# Patient Record
Sex: Female | Born: 1970 | Race: White | Hispanic: No | Marital: Single | Smoking: Current every day smoker
Health system: Southern US, Community
[De-identification: ages and names within clinical notes are randomized; demographics above are authoritative.]

## PROBLEM LIST (undated history)

## (undated) DIAGNOSIS — G43909 Migraine, unspecified, not intractable, without status migrainosus: Secondary | ICD-10-CM

## (undated) HISTORY — DX: Migraine, unspecified, not intractable, without status migrainosus: G43.909

---

## 2007-11-24 ENCOUNTER — Ambulatory Visit: Payer: Self-pay | Admitting: Internal Medicine

## 2008-11-05 ENCOUNTER — Encounter: Payer: Self-pay | Admitting: Maternal & Fetal Medicine

## 2009-01-03 ENCOUNTER — Observation Stay: Payer: Self-pay

## 2009-01-04 ENCOUNTER — Ambulatory Visit: Payer: Self-pay | Admitting: Unknown Physician Specialty

## 2009-01-19 ENCOUNTER — Observation Stay: Payer: Self-pay

## 2009-01-22 ENCOUNTER — Observation Stay: Payer: Self-pay

## 2009-02-07 ENCOUNTER — Observation Stay: Payer: Self-pay

## 2009-03-03 ENCOUNTER — Inpatient Hospital Stay: Payer: Self-pay | Admitting: Obstetrics and Gynecology

## 2010-08-26 ENCOUNTER — Ambulatory Visit: Payer: Self-pay

## 2010-09-10 ENCOUNTER — Emergency Department: Payer: Self-pay | Admitting: Emergency Medicine

## 2011-03-12 ENCOUNTER — Ambulatory Visit: Payer: Self-pay | Admitting: General Surgery

## 2011-11-30 ENCOUNTER — Emergency Department: Payer: Self-pay | Admitting: Emergency Medicine

## 2011-11-30 LAB — COMPREHENSIVE METABOLIC PANEL
Albumin: 4.3 g/dL (ref 3.4–5.0)
Anion Gap: 9 (ref 7–16)
BUN: 12 mg/dL (ref 7–18)
Calcium, Total: 8.8 mg/dL (ref 8.5–10.1)
Creatinine: 0.62 mg/dL (ref 0.60–1.30)
Glucose: 90 mg/dL (ref 65–99)
Osmolality: 284 (ref 275–301)
Potassium: 4 mmol/L (ref 3.5–5.1)
Sodium: 143 mmol/L (ref 136–145)
Total Protein: 7.8 g/dL (ref 6.4–8.2)

## 2011-11-30 LAB — TROPONIN I: Troponin-I: <0.02 ng/mL

## 2011-11-30 LAB — URINALYSIS, COMPLETE
Leukocyte Esterase: NEGATIVE
Nitrite: NEGATIVE
Ph: 7 (ref 4.5–8.0)
Protein: NEGATIVE
RBC,UR: <1 /HPF (ref 0–5)

## 2011-11-30 LAB — CBC
HGB: 14 g/dL (ref 12.0–16.0)
MCH: 33 pg (ref 26.0–34.0)
MCHC: 33.8 g/dL (ref 32.0–36.0)
MCV: 98 fL (ref 80–100)
RBC: 4.25 10*6/uL (ref 3.80–5.20)
WBC: 6 10*3/uL (ref 3.6–11.0)

## 2011-11-30 LAB — CK TOTAL AND CKMB (NOT AT ARMC)
CK, Total: 92 U/L (ref 21–215)
CK-MB: 0.9 ng/mL (ref 0.5–3.6)

## 2012-01-05 ENCOUNTER — Encounter: Payer: Self-pay | Admitting: Internal Medicine

## 2012-01-05 ENCOUNTER — Telehealth: Payer: Self-pay | Admitting: *Deleted

## 2012-01-05 ENCOUNTER — Ambulatory Visit (INDEPENDENT_AMBULATORY_CARE_PROVIDER_SITE_OTHER): Payer: BC Managed Care – PPO | Admitting: Internal Medicine

## 2012-01-05 VITALS — BP 112/70 | HR 99 | Temp 98.5°F | Ht 63.5 in | Wt 140.0 lb

## 2012-01-05 DIAGNOSIS — R079 Chest pain, unspecified: Secondary | ICD-10-CM | POA: Insufficient documentation

## 2012-01-05 DIAGNOSIS — K5900 Constipation, unspecified: Secondary | ICD-10-CM | POA: Insufficient documentation

## 2012-01-05 DIAGNOSIS — R109 Unspecified abdominal pain: Secondary | ICD-10-CM

## 2012-01-05 DIAGNOSIS — R1904 Left lower quadrant abdominal swelling, mass and lump: Secondary | ICD-10-CM

## 2012-01-05 LAB — POCT URINALYSIS DIPSTICK
Ketones, UA: NEGATIVE
Leukocytes, UA: NEGATIVE
Protein, UA: NEGATIVE
pH, UA: 5.5

## 2012-01-05 LAB — COMPREHENSIVE METABOLIC PANEL
ALT: 16 U/L (ref 0–35)
Albumin: 4.5 g/dL (ref 3.5–5.2)
CO2: 24 mEq/L (ref 19–32)
Calcium: 9.4 mg/dL (ref 8.4–10.5)
Chloride: 102 mEq/L (ref 96–112)
GFR: 121.9 mL/min (ref >60.00)
Potassium: 4.1 mEq/L (ref 3.5–5.1)
Sodium: 140 mEq/L (ref 135–145)
Total Bilirubin: 1.3 mg/dL — ABNORMAL HIGH (ref 0.3–1.2)
Total Protein: 7.3 g/dL (ref 6.0–8.3)

## 2012-01-05 LAB — LIPID PANEL: HDL: 62.9 mg/dL (ref >39.00)

## 2012-01-05 LAB — TSH: TSH: 0.66 u[IU]/mL (ref 0.35–5.50)

## 2012-01-05 LAB — CBC WITH DIFFERENTIAL/PLATELET
Basophils Absolute: 0 10*3/uL (ref 0.0–0.1)
HCT: 40.2 % (ref 36.0–46.0)
Lymphs Abs: 1.5 10*3/uL (ref 0.7–4.0)
Monocytes Relative: 6.9 % (ref 3.0–12.0)
Platelets: 281 10*3/uL (ref 150.0–400.0)
RDW: 13 % (ref 11.5–14.6)

## 2012-01-05 MED ORDER — DEXLANSOPRAZOLE 60 MG PO CPDR: 60.0000 mg | DELAYED_RELEASE_CAPSULE | Freq: Every day | ORAL | Status: DC

## 2012-01-05 NOTE — Telephone Encounter (Signed)
Pt needs urine pregnancy test for CT tomorrow. Pt coming back in, will fax results to [phone] (CT at [facility])

## 2012-01-05 NOTE — Progress Notes (Signed)
Subjective:    Patient ID: Veronica Stevens, female    DOB: December 28, 1970, 41 y.o.   MRN: [id number]  HPI 41 year old female presents to establish care. She has several concerns today. First, she notes a several month history of left upper quadrant abdominal pain. She notes that the pain is intermittent. She has not been able to identify any particular triggers such as food triggers. The pain is described as aching. She was evaluated at that [facility] ER for this pain and reports having lab work which was normal. She also notes having ultrasound of her spleen which was normal. The pain has persisted. She also describes some pain within her left chest which she describes as heaviness. This pain is not associated with shortness of breath, diaphoresis, or palpitations. It too comes and goes without any particular trigger or intervention. She was concerned about breath pathology and reports that she had evaluation by a surgeon including ultrasound of her left breast which was normal. She is also concerned about a mass within her left lower abdomen. She first noticed this several weeks ago. It is not painful. It has not changed in size. She denies any diarrhea. She does have chronic constipation for which she takes MiraLax. She denies any blood in her stool. She has never had a colonoscopy. She denies any nausea or vomiting. She reports a normal appetite. She denies weight loss or night sweats.  Outpatient Encounter Prescriptions as of 01/05/2012  Medication Sig Dispense Refill  . dexlansoprazole (DEXILANT) 60 MG capsule Take 1 capsule (60 mg total) by mouth daily.  30 capsule  0  . GREEN TEA, CAMILLIA SINENSIS, PO Take 750 mg by mouth daily.      . Multiple Vitamins-Minerals (MULTIVITAMIN WITH MINERALS) tablet Take 1 tablet by mouth daily.      . naproxen sodium (ANAPROX) 220 MG tablet Take 220 mg by mouth daily as needed.      Marland Kitchen OVER THE COUNTER MEDICATION Cell Activator 2 daily         Review of Systems  Constitutional: Negative for fever, chills, appetite change, fatigue and unexpected weight change.  HENT: Negative for ear pain, congestion, sore throat, trouble swallowing, neck pain, voice change and sinus pressure.   Eyes: Negative for visual disturbance.  Respiratory: Negative for cough, shortness of breath, wheezing and stridor.   Cardiovascular: Positive for chest pain. Negative for palpitations and leg swelling.  Gastrointestinal: Positive for abdominal pain and constipation. Negative for nausea, vomiting, diarrhea, blood in stool, abdominal distention and anal bleeding.  Genitourinary: Negative for dysuria and flank pain.  Musculoskeletal: Negative for myalgias, arthralgias and gait problem.  Skin: Negative for color change and rash.  Neurological: Negative for dizziness and headaches.  Hematological: Negative for adenopathy. Does not bruise/bleed easily.  Psychiatric/Behavioral: Negative for suicidal ideas, sleep disturbance and dysphoric mood. The patient is not nervous/anxious.    BP 112/70  Pulse 99  Temp(Src) 98.5 F (36.9 C) (Oral)  Ht 5' 3.5" (1.613 m)  Wt 140 lb (63.504 kg)  BMI 24.41 kg/m2  SpO2 99%     Objective:   Physical Exam  Constitutional: She is oriented to person, place, and time. She appears well-developed and well-nourished. No distress.  HENT:  Head: Normocephalic and atraumatic.  Right Ear: External ear normal.  Left Ear: External ear normal.  Nose: Nose normal.  Mouth/Throat: Oropharynx is clear and moist. No oropharyngeal exudate.  Eyes: Conjunctivae are normal. Pupils are equal, round, and reactive to light. Right  eye exhibits no discharge. Left eye exhibits no discharge. No scleral icterus.  Neck: Normal range of motion. Neck supple. No tracheal deviation present. No thyromegaly present.  Cardiovascular: Normal rate, regular rhythm, normal heart sounds and intact distal pulses.  Exam reveals no gallop and no friction rub.    No murmur heard. Pulmonary/Chest: Effort normal and breath sounds normal. No respiratory distress. She has no wheezes. She has no rales. She exhibits no tenderness.  Abdominal: Soft. Bowel sounds are normal. She exhibits mass (approximately 3cm diameter left lower quadrant). She exhibits no distension. There is no tenderness. There is no rebound and no guarding.  Musculoskeletal: Normal range of motion. She exhibits no edema and no tenderness.  Lymphadenopathy:    She has no cervical adenopathy.  Neurological: She is alert and oriented to person, place, and time. No cranial nerve deficit. She exhibits normal muscle tone. Coordination normal.  Skin: Skin is warm and dry. No rash noted. She is not diaphoretic. No erythema. No pallor.  Psychiatric: She has a normal mood and affect. Her behavior is normal. Judgment and thought content normal.          Assessment & Plan:

## 2012-01-05 NOTE — Progress Notes (Signed)
Addended by: Jobie Quaker on: 01/05/2012 04:24 PM   Modules accepted: Orders

## 2012-01-05 NOTE — Assessment & Plan Note (Signed)
Question if pain may be referred from left upper quadrant. EKG was normal today. Patient does however have some risk for myocardial ischemia including smoking history. Will start by getting CT of the abdomen and pelvis to evaluate left lower quadrant mass. We'll also treat with proton pump inhibitor. If pain persist , will consider cardiology evaluation for stress test.

## 2012-01-05 NOTE — Assessment & Plan Note (Signed)
Chronic. We discussed increasing intake of MiraLax to up to 3 times per day. We also discussed potentially trying Amitiza. Will wait on results of CT of the abdomen prior to changing medication.

## 2012-01-05 NOTE — Assessment & Plan Note (Signed)
Patient has a palpable mass in her left lower quadrant. The mass seems most consistent with an enlarged lymph node, however bowel oral. Pathology would also be a consideration. Will get CT of the abdomen and pelvis for further evaluation.

## 2012-01-05 NOTE — Telephone Encounter (Signed)
Copy of pregnancy test faxed to [facility]

## 2012-01-05 NOTE — Assessment & Plan Note (Signed)
Patient reports recent workup for this at [facility] was normal. Question if symptoms may be related to gastritis or ulcer. Will repeat labs including CMP and we'll test for H. pylori today. Will start empiric proton pump inhibitor. Patient will have CT of the abdomen given that she has a palpable mass in her left lower quadrant, however I do not think this is causing her left upper quadrant pain. Followup in 2 weeks.

## 2012-01-06 ENCOUNTER — Ambulatory Visit: Payer: Self-pay | Admitting: Internal Medicine

## 2012-01-06 ENCOUNTER — Telehealth: Payer: Self-pay | Admitting: *Deleted

## 2012-01-06 NOTE — Telephone Encounter (Signed)
Message copied by Vernie Murders on Wed Jan 06, 2012  5:56 PM ------      Message from: Ronna Polio A      Created: Tue Jan 05, 2012  4:10 PM       Labs were fine except that bilirubin level just on upper limit of normal.  I will look over abdominal US report from [facility] and we will visualize pancreas, liver, gallbladder on CT abdomen to see if any underlying abnormality leading to elevated bili.

## 2012-01-06 NOTE — Telephone Encounter (Signed)
Mychart mess sent

## 2012-01-07 ENCOUNTER — Telehealth: Payer: Self-pay | Admitting: Internal Medicine

## 2012-01-07 ENCOUNTER — Encounter: Payer: Self-pay | Admitting: Internal Medicine

## 2012-01-07 DIAGNOSIS — R935 Abnormal findings on diagnostic imaging of other abdominal regions, including retroperitoneum: Secondary | ICD-10-CM

## 2012-01-07 LAB — HELICOBACTER PYLORI  ANTIBODY, IGM: Helicobacter pylori, IgM: 0 U/mL (ref <9.0)

## 2012-01-07 NOTE — Telephone Encounter (Signed)
CT abdomen showed solid area in the liver.  This may represent a benign lesion such as a hemangioma, or may be a malignant lesion.  Radiology recommended setting up MRI for further evaluation.  Order placed. Tried to contact patient, but no answer, so left message to call back.  Also, atherosclerosis noted on CT in the aorta.  Pt should strongly consider smoking cessation, as she is very young to already have plaque in her blood vessels.

## 2012-01-07 NOTE — Telephone Encounter (Signed)
Dr Dan Humphreys spoke w/pt and ordered MRI

## 2012-01-12 ENCOUNTER — Encounter: Payer: Self-pay | Admitting: Internal Medicine

## 2012-01-13 ENCOUNTER — Ambulatory Visit: Payer: Self-pay | Admitting: Internal Medicine

## 2012-01-13 LAB — CREATININE, SERUM: EGFR (African American): >60

## 2012-01-15 ENCOUNTER — Telehealth: Payer: Self-pay | Admitting: Internal Medicine

## 2012-01-15 NOTE — Telephone Encounter (Signed)
[phone] Pt called to get mri results  These were done [facility] on Wednesday night

## 2012-01-15 NOTE — Telephone Encounter (Signed)
My chart message was sent today. She viewed the message today at 3:18

## 2012-01-15 NOTE — Telephone Encounter (Signed)
MRI abdomen showed benign hemangioma in the liver.  They recommended repeating ultrasound in 1-2 years to show that it is stable.

## 2012-01-19 ENCOUNTER — Encounter: Payer: Self-pay | Admitting: Internal Medicine

## 2012-01-25 ENCOUNTER — Ambulatory Visit: Payer: BC Managed Care – PPO | Admitting: Internal Medicine

## 2012-04-25 ENCOUNTER — Telehealth: Payer: Self-pay | Admitting: Internal Medicine

## 2012-04-25 NOTE — Telephone Encounter (Signed)
Caller: Veronica Stevens/Patient; PCP: Ronna Polio; CB#: [phone];  Call regarding Sore Throat;  Onset: 04/23/12.  Temp 99.5 po at 1100.  LMP unknown; Mirena IUD. Throat red with white pustules and bilateral ear pain.  Advised to see MD within 4 hrs for marked difficulty swallowing d/t sore throat unresponsive to 12 hrs of home care per Sore Throat or Hoarseness Guideline. No appts remain for 04/25/12; info noted and sent to Guy/Can Pool for call back to pt.

## 2012-04-25 NOTE — Telephone Encounter (Signed)
Should go to urgent care as no appointments.

## 2012-04-25 NOTE — Telephone Encounter (Signed)
Patient advised as instructed via telephone. 

## 2012-12-17 ENCOUNTER — Other Ambulatory Visit: Payer: Self-pay

## 2013-09-07 ENCOUNTER — Other Ambulatory Visit: Payer: Self-pay

## 2014-10-31 ENCOUNTER — Ambulatory Visit: Payer: Self-pay | Admitting: Orthopedic Surgery

## 2015-11-26 ENCOUNTER — Other Ambulatory Visit: Payer: Self-pay | Admitting: Student

## 2015-11-26 DIAGNOSIS — R1032 Left lower quadrant pain: Secondary | ICD-10-CM

## 2015-12-03 ENCOUNTER — Ambulatory Visit
Admission: RE | Admit: 2015-12-03 | Discharge: 2015-12-03 | Disposition: A | Discharge status: Home / Self Care | Payer: BLUE CROSS/BLUE SHIELD | Source: Ambulatory Visit | Attending: Student | Admitting: Student

## 2015-12-03 DIAGNOSIS — R1032 Left lower quadrant pain: Secondary | ICD-10-CM | POA: Diagnosis not present

## 2015-12-03 DIAGNOSIS — D1809 Hemangioma of other sites: Secondary | ICD-10-CM | POA: Diagnosis not present

## 2015-12-03 MED ORDER — IOHEXOL 300 MG/ML  SOLN
100.0000 mL | Freq: Once | INTRAMUSCULAR | Status: AC | PRN
  Administered 2015-12-03: 100 mL via INTRAVENOUS

## 2016-08-23 IMAGING — CT CT ABD-PELV W/ CM
1 of 3 series · 14 of 32 positions shown, 19 images · IV contrast (omnipaque)
Comparison: 01/06/2012, 01/13/2012

CLINICAL DATA: Left lower quadrant pain for several months, initial
encounter

EXAM:
CT ABDOMEN AND PELVIS WITH CONTRAST
TECHNIQUE: Multidetector CT imaging of the abdomen and pelvis was performed
using the standard protocol following bolus administration of
intravenous contrast.
CONTRAST:  100mL OMNIPAQUE IOHEXOL 300 MG/ML  SOLN

[Series 2: axial st · axial · 0.66mm/px · z∈[-965,-575]mm · 14 of 88 slices shown, 19 images]
[im 5/88  soft-tissue]
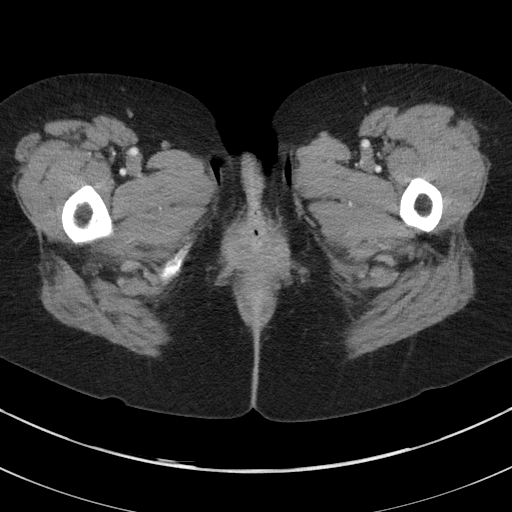
[im 5/88  bone]
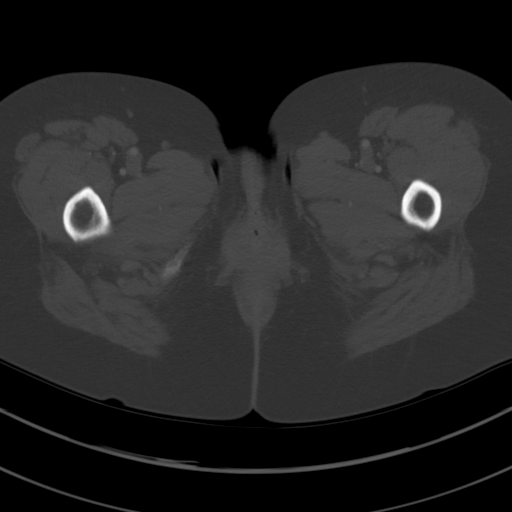
[im 14/88  soft-tissue]
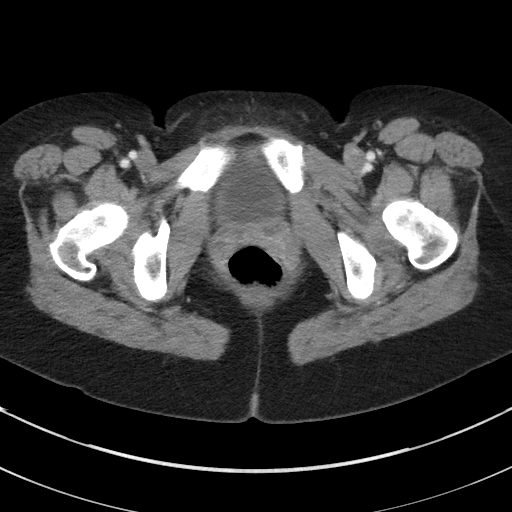
[im 19/88  soft-tissue]
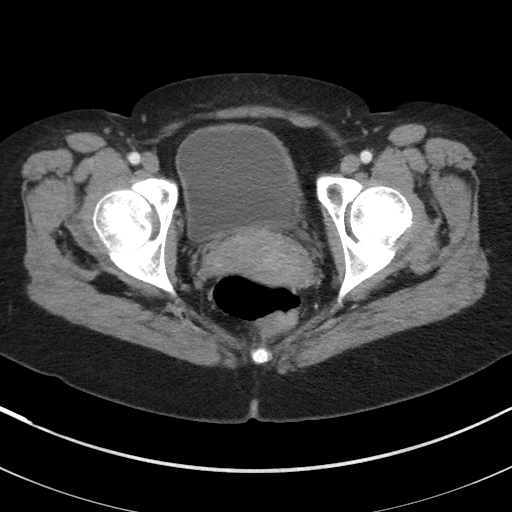
[im 23/88  soft-tissue]
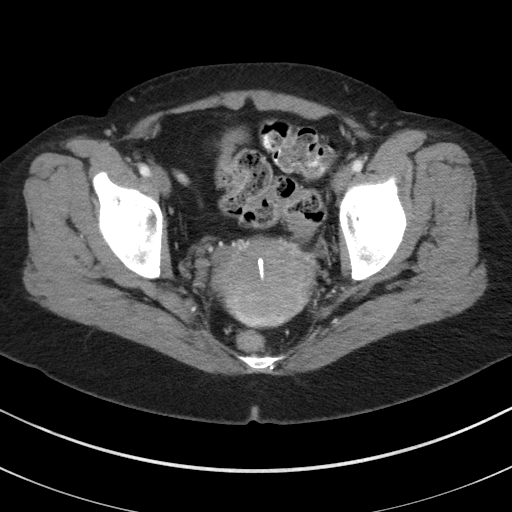
[im 33/88  soft-tissue]
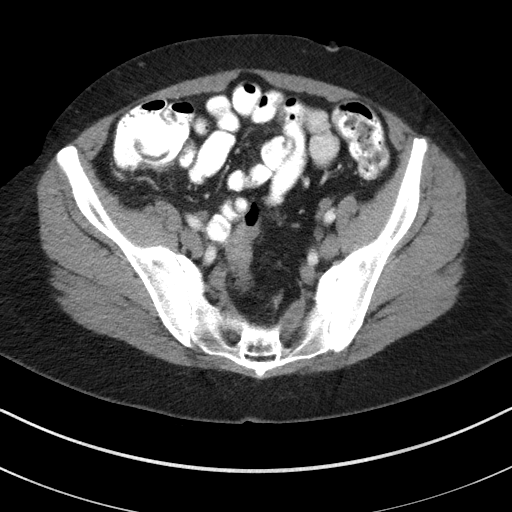
[im 37/88  soft-tissue]
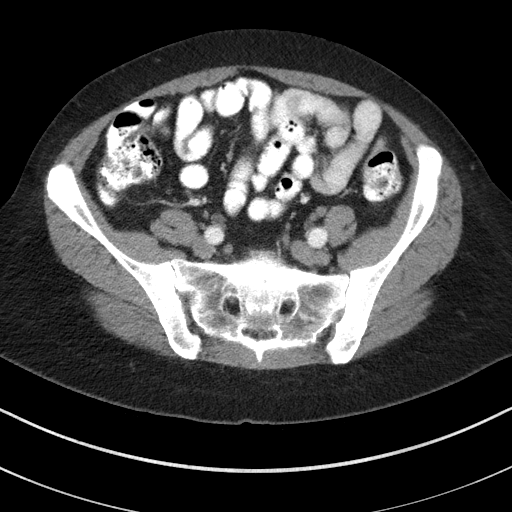
[im 46/88  soft-tissue]
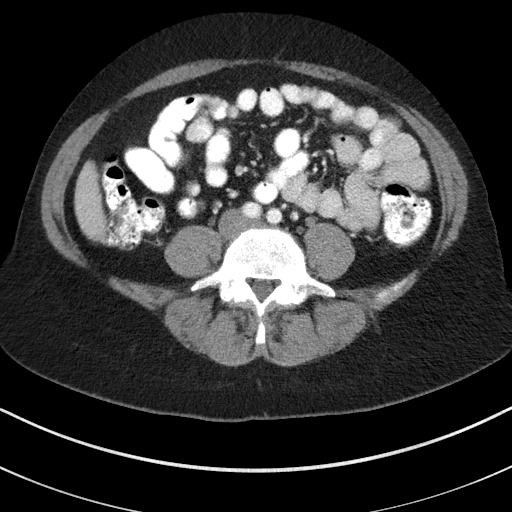
[im 51/88  soft-tissue]
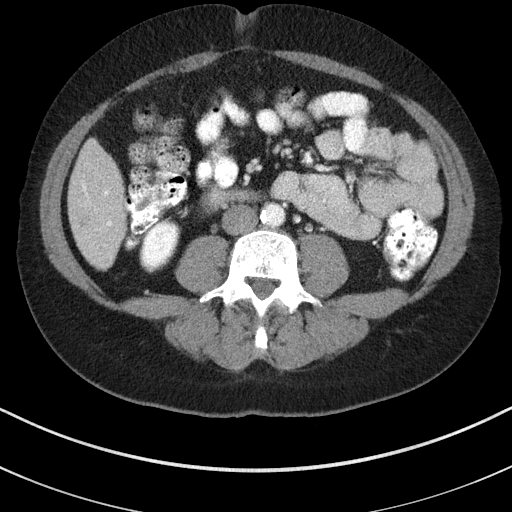
[im 55/88  soft-tissue]
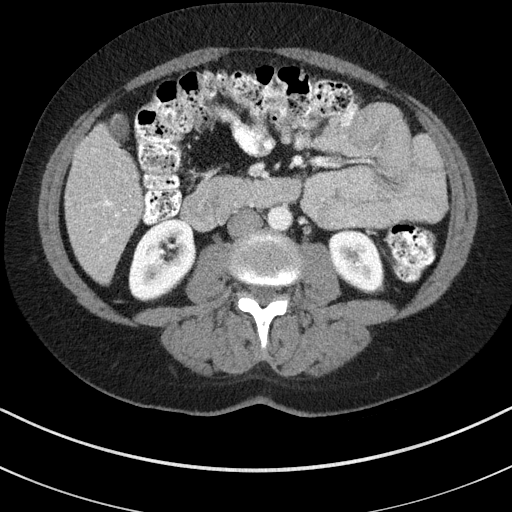
[im 55/88  bone]
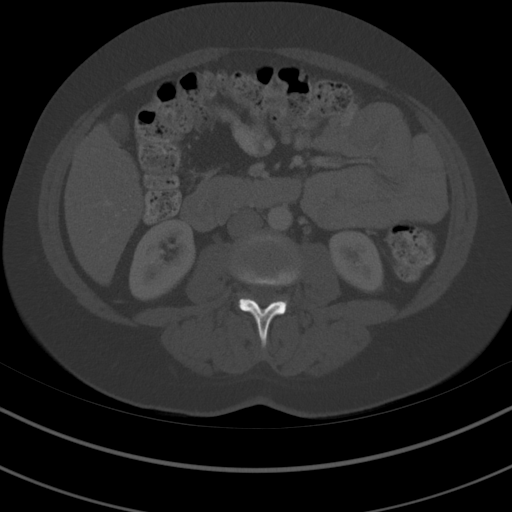
[im 65/88  soft-tissue]
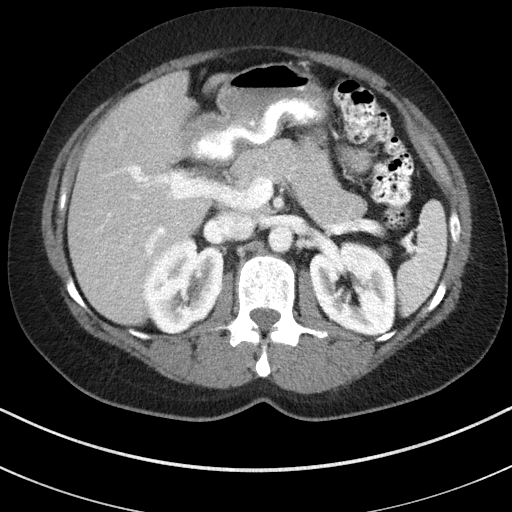
[im 69/88  soft-tissue]
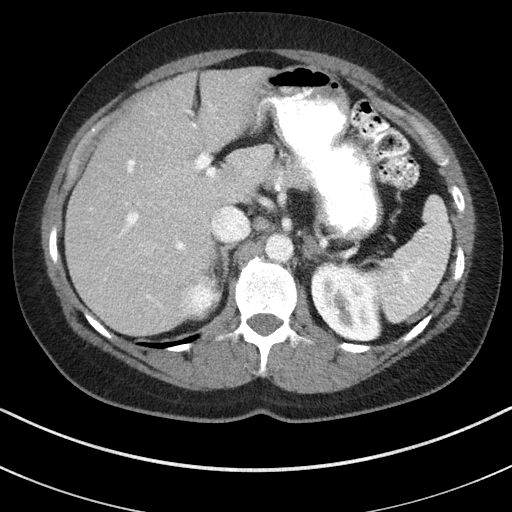
[im 69/88  lung]
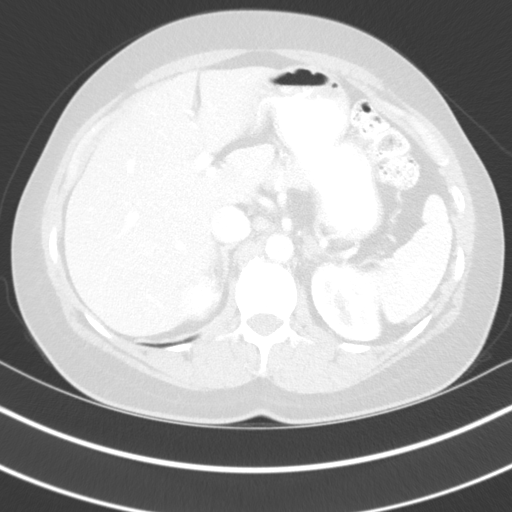
[im 74/88  soft-tissue]
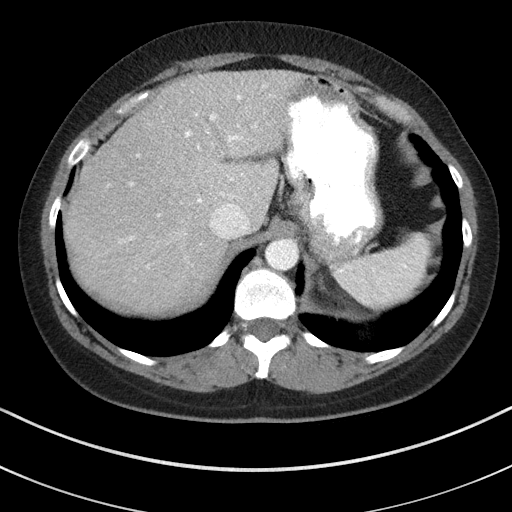
[im 74/88  lung]
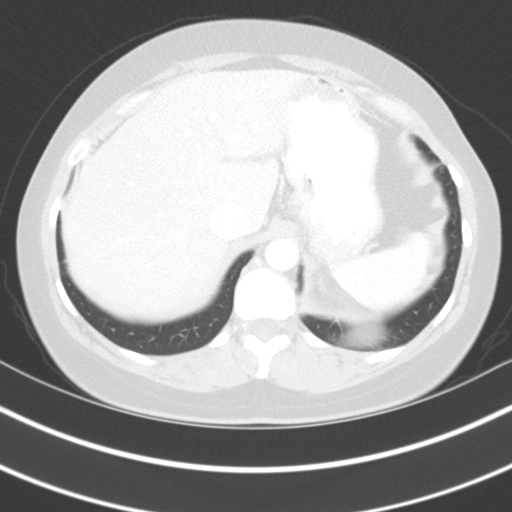
[im 78/88  lung]
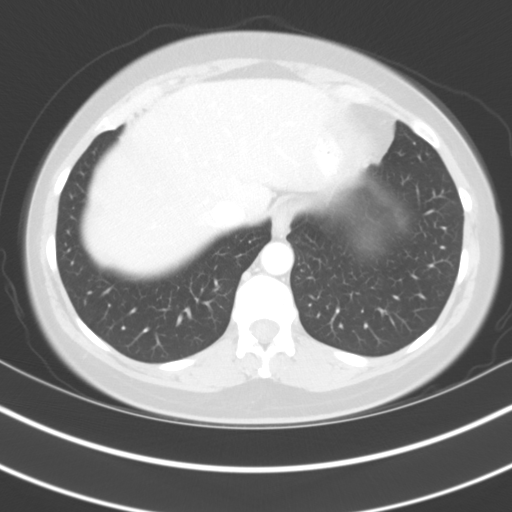
[im 83/88  soft-tissue]
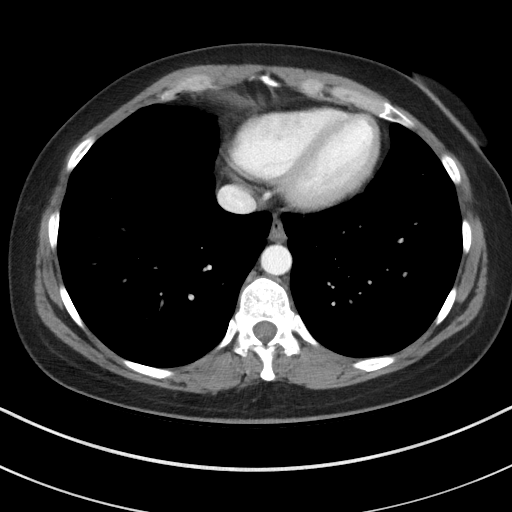
[im 83/88  lung]
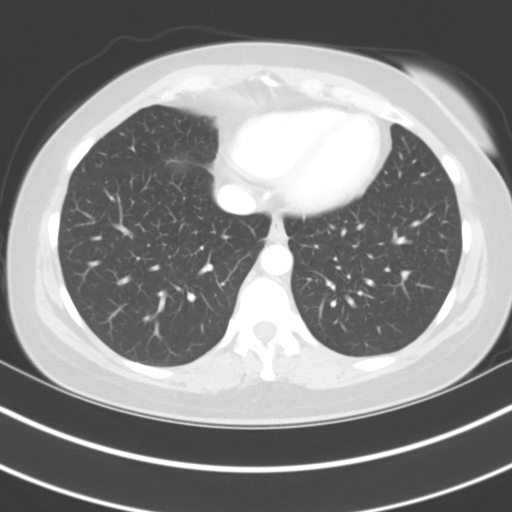

[14 of 32 positions shown; findings below may reference images not displayed]

FINDINGS: Lung bases are free of acute infiltrate or sizable effusion.

The liver again shows evidence of a peripherally enhancing lesion in
the right lobe inferiorly. It measures approximately 2 cm in
greatest dimension and is stable from the prior exams. Additionally
a smaller hypodensity is noted inferiorly which is not well
visualized on the delayed images consistent with a smaller
hemangioma. This also corresponds to that seen on prior MRI.

The gallbladder, spleen, adrenal glands and pancreas are within
normal limits. The kidneys are well visualized bilaterally without
renal calculi or obstructive changes. Delayed images demonstrate
normal excretion of contrast bilaterally.

The appendix is well visualized. The uterus shows an IUD in place
stable from the prior exam. Previously seen ovarian cystic lesion is
not well appreciated on this exam. The bladder is well distended. No
free pelvic fluid is noted. The area of clinical concern no focal
abnormality is noted. Mild aortoiliac calcifications are seen. The
osseous structures show stable sclerosis in the left sacroiliac
joint. No acute bony abnormality is seen.
IMPRESSION: Stable hemangiomas within the liver.

No other focal abnormality is seen.

## 2017-09-02 ENCOUNTER — Ambulatory Visit: Payer: Self-pay | Admitting: Certified Nurse Midwife

## 2017-09-16 ENCOUNTER — Ambulatory Visit: Payer: Self-pay | Admitting: Certified Nurse Midwife

## 2021-06-05 ENCOUNTER — Encounter: Payer: Self-pay | Admitting: Obstetrics and Gynecology

## 2021-06-05 ENCOUNTER — Other Ambulatory Visit (HOSPITAL_COMMUNITY)
Admission: RE | Admit: 2021-06-05 | Discharge: 2021-06-05 | Disposition: A | Discharge status: Home / Self Care | Payer: Medicaid Other | Source: Ambulatory Visit | Attending: Obstetrics and Gynecology | Admitting: Obstetrics and Gynecology

## 2021-06-05 ENCOUNTER — Other Ambulatory Visit: Payer: Self-pay

## 2021-06-05 ENCOUNTER — Ambulatory Visit (INDEPENDENT_AMBULATORY_CARE_PROVIDER_SITE_OTHER): Payer: Medicaid Other | Admitting: Obstetrics and Gynecology

## 2021-06-05 VITALS — Wt 162.0 lb

## 2021-06-05 DIAGNOSIS — R300 Dysuria: Secondary | ICD-10-CM | POA: Diagnosis present

## 2021-06-05 DIAGNOSIS — B9620 Unspecified Escherichia coli [E. coli] as the cause of diseases classified elsewhere: Secondary | ICD-10-CM | POA: Diagnosis not present

## 2021-06-05 DIAGNOSIS — R1032 Left lower quadrant pain: Secondary | ICD-10-CM

## 2021-06-05 DIAGNOSIS — Z113 Encounter for screening for infections with a predominantly sexual mode of transmission: Secondary | ICD-10-CM | POA: Insufficient documentation

## 2021-06-05 DIAGNOSIS — Z1151 Encounter for screening for human papillomavirus (HPV): Secondary | ICD-10-CM | POA: Insufficient documentation

## 2021-06-05 DIAGNOSIS — Z975 Presence of (intrauterine) contraceptive device: Secondary | ICD-10-CM | POA: Insufficient documentation

## 2021-06-05 DIAGNOSIS — Z124 Encounter for screening for malignant neoplasm of cervix: Secondary | ICD-10-CM

## 2021-06-05 DIAGNOSIS — Z01411 Encounter for gynecological examination (general) (routine) with abnormal findings: Secondary | ICD-10-CM | POA: Diagnosis not present

## 2021-06-05 DIAGNOSIS — N390 Urinary tract infection, site not specified: Secondary | ICD-10-CM | POA: Diagnosis not present

## 2021-06-05 DIAGNOSIS — R319 Hematuria, unspecified: Secondary | ICD-10-CM

## 2021-06-05 DIAGNOSIS — N852 Hypertrophy of uterus: Secondary | ICD-10-CM

## 2021-06-05 DIAGNOSIS — R102 Pelvic and perineal pain: Secondary | ICD-10-CM

## 2021-06-05 LAB — POCT URINALYSIS DIPSTICK
Appearance: NORMAL
Bilirubin, UA: NEGATIVE
Blood, UA: NEGATIVE
Glucose, UA: NEGATIVE
Ketones, UA: NEGATIVE
Nitrite, UA: NEGATIVE
Odor: NORMAL
Protein, UA: NEGATIVE
Spec Grav, UA: 1.02 (ref 1.010–1.025)
Urobilinogen, UA: 0.2 E.U./dL
pH, UA: 6 (ref 5.0–8.0)

## 2021-06-05 NOTE — Progress Notes (Signed)
Obstetrics & Gynecology Office Visit   Chief Complaint  Patient presents with   Urinary Tract Infection    Pain & pressure with urination x 1 mo. Blood in urine on Saturday 05/31/21   Abdominal Pain    Left lower quadrant/Ovary area x1 wk    History of Present Illness: 50 y.o. [id number] female who presents with left lower quadrant pain.  The pain has been consistent over the last week. However, she has had it off and on over several years.  The pain radiates up to he ribs, goes to her back area.  Alleviating factors: nothing. Aggravating: nothing.  Associated symptoms: intercourse may make it feel worse.  She describes the pain as pulsing, throbbing, stabbing sensation.  She rates the pain at 5/10.   She also feels like she is getting UTI symptoms. She had blood in her urine this past weekend.  She also started having discomfort with intercourse with her fiance.  This has been present for the past couple of days.     She believes her Mirena IUD placed in 2015.    For her UTI symptoms, she has pressure while voiding, she notes no increase in frequency.  She does drink a lot of water. She felt like she had one about a month ago and her symptoms disappeared.    She is not having periods with her IUD.  She notes no abnormal discharge. She denies vaginal itching, burning, irritation.  She denies diarrhea and constipation.  She denies fevers, chills.   Past Medical History:  Diagnosis Date   Migraines     Past Surgical History:  Procedure Laterality Date   VAGINAL DELIVERY     x4    Gynecologic History: No LMP recorded. (Menstrual status: IUD).  Obstetric [id number], s/p SVD x 4  Family History  Problem Relation Age of Onset   Cancer Maternal Grandmother        Lung & Bone - 82's   Thyroid disease Mother    Early death Brother        suicide   Early death Brother        suicide   Other Brother        Drug abuse   Other Brother        Drug abuse   Cancer Maternal Uncle         Lung - 64's   Ovarian cancer Neg Hx    Breast cancer Neg Hx     Social History   Socioeconomic History   Marital status: Single    Spouse name: Not on file   Number of children: 4   Years of education: Not on file   Highest education level: Not on file  Occupational History   Not on file  Tobacco Use   Smoking status: Every Day    Packs/day: 1.00    Types: Cigarettes   Smokeless tobacco: Never  Vaping Use   Vaping Use: Never used  Substance and Sexual Activity   Alcohol use: Yes    Comment: once week   Drug use: No   Sexual activity: Yes    Birth control/protection: I.U.D.  Other Topics Concern   Not on file  Social History Narrative   Lives in [place] with husband and 3 sons. Worked at [employer].      Regular Exercise -  NO   Daily Caffeine Use:  2 cups coffee            Social Determinants  of Health   Financial Resource Strain: Not on file  Food Insecurity: Not on file  Transportation Needs: Not on file  Physical Activity: Not on file  Stress: Not on file  Social Connections: Not on file  Intimate Partner Violence: Not on file    No Known Allergies  Prior to Admission medications   Medication Sig Start Date End Date Taking? Authorizing Provider  ibuprofen (ADVIL) 200 MG tablet Take by mouth.   Yes [provider]  levonorgestrel (MIRENA) 20 MCG/DAY IUD by Intrauterine route.   Yes [provider]  Multiple Vitamins-Minerals (MULTIVITAMIN WITH MINERALS) tablet Take 1 tablet by mouth daily.   Yes [provider]    Review of Systems  Constitutional: Negative.   HENT: Negative.    Eyes: Negative.   Respiratory: Negative.    Cardiovascular: Negative.   Gastrointestinal: Negative.   Genitourinary: Negative.   Musculoskeletal: Negative.   Skin: Negative.   Neurological: Negative.   Psychiatric/Behavioral: Negative.      Physical Exam BP (P) 120/80 (Cuff Size: Normal)   Pulse (P) 94   Ht (P) '5\' 4"'$   (1.626 m)   Wt 162 lb (73.5 kg)   BMI (P) 27.81 kg/m  No LMP recorded. (Menstrual status: IUD). Physical Exam Constitutional:      General: She is not in acute distress.    Appearance: Normal appearance. She is well-developed.  Genitourinary:     Vulva, bladder and urethral meatus normal.     No lesions in the vagina.     Right Labia: No rash, tenderness, lesions, skin changes or Bartholin's cyst.    Left Labia: No tenderness, lesions, skin changes, Bartholin's cyst or rash.    No inguinal adenopathy present in the right or left side.    Pelvic Tanner Score: 5/5.    No vaginal erythema, tenderness or bleeding.      Right Adnexa: not tender, not full and no mass present.    Left Adnexa: full and palpable.    Left Adnexa: not tender and no mass present.    No cervical motion tenderness, friability, lesion or polyp.     IUD strings visualized.     Uterus is enlarged and irregular.     Uterus is not fixed or tender.     Uterus is anteverted.     No urethral tenderness or mass present.     Pelvic exam was performed with patient in the lithotomy position.  HENT:     Head: Normocephalic and atraumatic.  Eyes:     General: No scleral icterus.    Conjunctiva/sclera: Conjunctivae normal.  Cardiovascular:     Rate and Rhythm: Normal rate and regular rhythm.     Heart sounds: No murmur heard.   No friction rub. No gallop.  Pulmonary:     Effort: Pulmonary effort is normal. No respiratory distress.     Breath sounds: Normal breath sounds. No wheezing or rales.  Abdominal:     General: Bowel sounds are normal. There is no distension.     Palpations: Abdomen is soft. There is no mass.     Tenderness: There is no abdominal tenderness. There is no guarding or rebound.     Hernia: There is no hernia in the left inguinal area or right inguinal area.  Musculoskeletal:        General: Normal range of motion.     Cervical back: Normal range of motion and neck supple.  Lymphadenopathy:      Lower Body:  No right inguinal adenopathy. No left inguinal adenopathy.  Neurological:     General: No focal deficit present.     Mental Status: She is alert and oriented to person, place, and time.     Cranial Nerves: No cranial nerve deficit.  Skin:    General: Skin is warm and dry.     Findings: No erythema.  Psychiatric:        Mood and Affect: Mood normal.        Behavior: Behavior normal.        Judgment: Judgment normal.    Female chaperone present for pelvic and breast  portions of the physical exam  Assessment: 50 y.o. No obstetric history on file. female here for  1. Left lower quadrant abdominal pain   2. Dysuria   3. Hematuria, unspecified type   4. Screen for STD (sexually transmitted disease)   5. Pap smear for cervical cancer screening   6. Adnexal tenderness, left   7. Enlarged uterus      Plan: Problem List Items Addressed This Visit   None Visit Diagnoses     Left lower quadrant abdominal pain    -  Primary   Relevant Orders   POCT Urinalysis Dipstick (Completed)   Cytology - PAP   Urine Culture   US PELVIC COMPLETE WITH TRANSVAGINAL   Dysuria       Relevant Orders   POCT Urinalysis Dipstick (Completed)   Urine Culture   Hematuria, unspecified type       Relevant Orders   POCT Urinalysis Dipstick (Completed)   Urine Culture   Screen for STD (sexually transmitted disease)       Relevant Orders   Cytology - PAP   Pap smear for cervical cancer screening       Relevant Orders   Cytology - PAP   Adnexal tenderness, left       Relevant Orders   US PELVIC COMPLETE WITH TRANSVAGINAL   Enlarged uterus       Relevant Orders   US PELVIC COMPLETE WITH TRANSVAGINAL      Left lower quadrant abdominal pain:  Pelvic ultrasound for pain and abnormal findings on exam Pap smear with STI screening  Dysuria/hematuria: Urine culture  Discussed findings and plan for diagnosis and possible management. Will need to exchange IUD, if she desires.   A total  of 35 minutes were spent face-to-face with the patient as well as preparation, review, communication, and documentation during this encounter.    Prentice Docker, MD 06/05/2021 1:24 PM

## 2021-06-06 ENCOUNTER — Telehealth: Payer: Self-pay

## 2021-06-06 NOTE — Telephone Encounter (Signed)
Pt calling; was seen yesterday for UTI; saw labs on MyChart showed 1+ leuks; doesn;t know if inf or not nor about a rx. [phone]  Adv what she saw is the urinalysis and the presence of leuks which fight inf.  Pt aware will get culture results over the weekend and will be notified of results. If rx needed it will be sent Monday.

## 2021-06-09 ENCOUNTER — Telehealth: Payer: Self-pay

## 2021-06-09 ENCOUNTER — Other Ambulatory Visit: Payer: Self-pay | Admitting: Advanced Practice Midwife

## 2021-06-09 DIAGNOSIS — N3000 Acute cystitis without hematuria: Secondary | ICD-10-CM

## 2021-06-09 LAB — URINE CULTURE

## 2021-06-09 MED ORDER — CEPHALEXIN 500 MG PO CAPS: 500.0000 mg | ORAL_CAPSULE | Freq: Three times a day (TID) | ORAL | 0 refills | Status: DC

## 2021-06-09 NOTE — Progress Notes (Signed)
Rx keflex sent to treat 50-100k e coli. Message to patient.

## 2021-06-09 NOTE — Telephone Encounter (Signed)
Patient just received message thru my chart on + urine culture results. Inquiring if she will be treated. [phone]

## 2021-06-09 NOTE — Telephone Encounter (Signed)
Sent patient Rx to treat UTI and message to let her know.

## 2021-06-09 NOTE — Telephone Encounter (Signed)
Spoke w/patient. Advised SDJ out of office until tomorrow. Sending message to Rod Can (on Call provider).

## 2021-06-10 ENCOUNTER — Ambulatory Visit: Payer: BLUE CROSS/BLUE SHIELD

## 2021-06-13 ENCOUNTER — Telehealth: Payer: Self-pay

## 2021-06-13 LAB — CYTOLOGY - PAP
Chlamydia: NEGATIVE
Comment: NEGATIVE
Comment: NEGATIVE
Comment: NEGATIVE
Comment: NEGATIVE
Comment: NORMAL
HPV 16: NEGATIVE
HPV 18 / 45: POSITIVE — AB
High risk HPV: POSITIVE — AB
Neisseria Gonorrhea: NEGATIVE
Trichomonas: NEGATIVE

## 2021-06-13 NOTE — Telephone Encounter (Signed)
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 She doesn't want to spend the whole weekend worrying about if something is wrong. [phone]

## 2021-06-13 NOTE — Telephone Encounter (Signed)
Spoke w/patient. Advised Dr. Glennon Mac is out of the office until Tuesday. Explained HPV infection/virus same as gential warts, can cause cervical cancer. Likely that she will be scheduled for a colposcopy for further evaluation. Explained procedure. Patient appreciative of information given. Will await call from Dr. Glennon Mac.

## 2021-06-17 NOTE — Telephone Encounter (Signed)
Pt calling; is SDJ going to call her today about her pap smear?  [phone]  Pt adv SDJ has been in OR today and is on call; msg has been sent to him and he will get back with her just can't tell her when.

## 2021-06-19 NOTE — Telephone Encounter (Signed)
Discussed Pap smear results.  Recommend colposcopy to prevent cervical cancer.  All questions answered.  We will schedule for soon.  Patient voiced agreement and understanding.

## 2021-06-24 ENCOUNTER — Telehealth: Payer: Self-pay

## 2021-06-24 NOTE — Telephone Encounter (Signed)
Pt calling to see if there is any prep she needs to do before colpo 07/11/21.  [phone]  Pt aware there is no prep.

## 2021-06-26 ENCOUNTER — Ambulatory Visit: Payer: Medicaid Other | Admitting: Obstetrics and Gynecology

## 2021-07-09 ENCOUNTER — Other Ambulatory Visit: Payer: Self-pay

## 2021-07-09 ENCOUNTER — Ambulatory Visit
Admission: RE | Admit: 2021-07-09 | Discharge: 2021-07-09 | Disposition: A | Discharge status: Home / Self Care | Payer: 59 | Source: Ambulatory Visit | Attending: Obstetrics and Gynecology | Admitting: Obstetrics and Gynecology

## 2021-07-09 DIAGNOSIS — R102 Pelvic and perineal pain: Secondary | ICD-10-CM | POA: Diagnosis present

## 2021-07-09 DIAGNOSIS — R1032 Left lower quadrant pain: Secondary | ICD-10-CM | POA: Diagnosis present

## 2021-07-09 DIAGNOSIS — N852 Hypertrophy of uterus: Secondary | ICD-10-CM | POA: Insufficient documentation

## 2021-07-11 ENCOUNTER — Other Ambulatory Visit (HOSPITAL_COMMUNITY)
Admission: RE | Admit: 2021-07-11 | Discharge: 2021-07-11 | Disposition: A | Discharge status: Home / Self Care | Payer: 59 | Source: Ambulatory Visit | Attending: Obstetrics and Gynecology | Admitting: Obstetrics and Gynecology

## 2021-07-11 ENCOUNTER — Ambulatory Visit: Payer: Medicaid Other | Admitting: Obstetrics and Gynecology

## 2021-07-11 ENCOUNTER — Other Ambulatory Visit: Payer: Self-pay

## 2021-07-11 ENCOUNTER — Ambulatory Visit (INDEPENDENT_AMBULATORY_CARE_PROVIDER_SITE_OTHER): Payer: 59 | Admitting: Obstetrics and Gynecology

## 2021-07-11 ENCOUNTER — Encounter: Payer: Self-pay | Admitting: Obstetrics and Gynecology

## 2021-07-11 VITALS — BP 128/74 | Ht 64.0 in | Wt 169.0 lb

## 2021-07-11 DIAGNOSIS — R87612 Low grade squamous intraepithelial lesion on cytologic smear of cervix (LGSIL): Secondary | ICD-10-CM | POA: Diagnosis not present

## 2021-07-11 NOTE — Progress Notes (Signed)
HPI:  Veronica Stevens is a 50 y.o.  [id number]  who presents today for evaluation and management of abnormal cervical cytology.    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+   OB History  Gravida Para Term Preterm AB Living  '5 4 3 1 1 4  '$ SAB IAB Ectopic Multiple Live Births  1       4    # Outcome Date GA Lbr Len/2nd Weight Sex Delivery Anes PTL Lv  5 Term 03/03/09    M Vag-Spont   LIV  4 Preterm 01/18/03    M Vag-Spont   LIV  3 SAB 03/2002          2 Term 08/29/00    M Vag-Spont   LIV  1 Term 01/13/91    Jerilynn Mages Vag-Spont   LIV    Past Medical History:  Diagnosis Date   Migraines     Past Surgical History:  Procedure Laterality Date   VAGINAL DELIVERY     x4    SOCIAL HISTORY:  Social History   Substance and Sexual Activity  Alcohol Use Yes   Comment: once week    Social History   Substance and Sexual Activity  Drug Use No     Family History  Problem Relation Age of Onset   Cancer Maternal Grandmother        Lung & Bone - 64's   Thyroid disease Mother    Early death Brother        suicide   Early death Brother        suicide   Other Brother        Drug abuse   Other Brother        Drug abuse   Cancer Maternal Uncle        Lung - 50's   Ovarian cancer Neg Hx    Breast cancer Neg Hx     ALLERGIES:  Patient has no known allergies.  Current Outpatient Medications on File Prior to Visit  Medication Sig Dispense Refill   cephALEXin (KEFLEX) 500 MG capsule Take 1 capsule (500 mg total) by mouth 3 (three) times daily. 21 capsule 0   ibuprofen (ADVIL) 200 MG tablet Take by mouth.     levonorgestrel (MIRENA) 20 MCG/DAY IUD by Intrauterine route.     Multiple Vitamins-Minerals (MULTIVITAMIN WITH MINERALS) tablet Take 1 tablet by mouth daily.     No current facility-administered medications on file prior to visit.    Physical Exam: -Vitals:  BP 128/74   Ht '5\' 4"'$  (1.626 m)   Wt 169 lb (76.7 kg)   BMI 29.01 kg/m  GEN: WD, WN, NAD.  A+ O x 3, good  mood and affect. ABD:  NT, ND.  Soft, no masses.  No hernias noted.   Pelvic:   Vulva: Normal appearance.  No lesions.  Vagina: No lesions or abnormalities noted.  Support: Normal pelvic support.  Urethra No masses tenderness or scarring.  Meatus Normal size without lesions or prolapse.  Cervix: See below.  Anus: Normal exam.  No lesions.  Perineum: Normal exam.  No lesions.        Bimanual   Uterus: Normal size.  Non-tender.  Mobile.  AV.  Adnexae: No masses.  Non-tender to palpation.  Cul-de-sac: Negative for abnormality.   PROCEDURE: 1.  Urine Pregnancy Test:  not done  2.  Colposcopy performed with 4% acetic acid after verbal consent obtained                                         -  Aceto-white Lesions Location(s): mildly at 5, and anterior from 10-12 o'clock.              -Biopsy performed at 5, 10, and 12 o'clock               -ECC indicated and performed: Yes.       -Biopsy sites made hemostatic with pressure, AgNO3, and/or Monsel's solution   -Satisfactory colposcopy: No.    -Evidence of Invasive cervical CA :  NO  ASSESSMENT:  Veronica Stevens is a 50 y.o. [id number] here for  1. LGSIL on Pap smear of cervix   .  PLAN: I discussed the grading system of pap smears and HPV high risk viral types.  We will discuss and base management after colpo results return.  Will decide about changing IUD based on these results. Reviewed results of ultrasound, which were normal.  Will contemplate next steps and discuss with her when we follow up with results from today.      Prentice Docker, MD  [facility], [facility] 07/11/2021  3:58 PM

## 2021-07-15 LAB — SURGICAL PATHOLOGY

## 2021-08-04 ENCOUNTER — Telehealth: Payer: Self-pay | Admitting: Obstetrics and Gynecology

## 2021-08-04 NOTE — Telephone Encounter (Signed)
Patient coming in on 09/08/2021 at 8:50 with SDJ for Mirena removal and reinsertion

## 2021-08-06 NOTE — Telephone Encounter (Signed)
Noted. Will order to arrive by apt date/time. 

## 2021-09-08 ENCOUNTER — Other Ambulatory Visit: Payer: Self-pay

## 2021-09-08 ENCOUNTER — Encounter: Payer: Self-pay | Admitting: Obstetrics and Gynecology

## 2021-09-08 ENCOUNTER — Ambulatory Visit (INDEPENDENT_AMBULATORY_CARE_PROVIDER_SITE_OTHER): Payer: 59 | Admitting: Obstetrics and Gynecology

## 2021-09-08 VITALS — BP 122/80 | HR 91 | Ht 64.0 in | Wt 160.0 lb

## 2021-09-08 DIAGNOSIS — Z30433 Encounter for removal and reinsertion of intrauterine contraceptive device: Secondary | ICD-10-CM

## 2021-09-08 MED ORDER — LEVONORGESTREL 20 MCG/DAY IU IUD: 1.0000 | INTRAUTERINE_SYSTEM | Freq: Once | INTRAUTERINE | 0 refills | Status: AC

## 2021-09-08 NOTE — Progress Notes (Signed)
   IUD Insertion Procedure Note (Mirena) Patient identified, informed consent performed, consent signed.   Discussed risks of irregular bleeding, cramping, infection, malpositioning, expulsion or uterine perforation of the IUD (1:1000 placements)  which may require further procedure such as laparoscopy.  IUD while effective at preventing pregnancy do not prevent transmission of sexually transmitted diseases and use of barrier methods for this purpose was discussed. Time out was performed.  Urine pregnancy test negative.  Speculum placed in the vagina.  Cervix visualized.  Cleaned with Betadine x 2.  Grasped anteriorly with a single tooth tenaculum.  Uterus sounded to 7.5 cm. IUD placed per manufacturer's recommendations.  Strings trimmed to 3 cm. Tenaculum was removed, good hemostasis noted.  Patient tolerated procedure well.   Patient was given post-procedure instructions.  She was advised to have backup contraception for one week.  Patient was also asked to check IUD strings periodically and follow up in 4 weeks for IUD check.    Prentice Docker, MD, [facility], [facility] 09/08/2021 9:17 AM

## 2021-09-30 ENCOUNTER — Other Ambulatory Visit: Payer: Self-pay

## 2021-09-30 ENCOUNTER — Encounter: Payer: Self-pay | Admitting: Obstetrics and Gynecology

## 2021-09-30 ENCOUNTER — Ambulatory Visit (INDEPENDENT_AMBULATORY_CARE_PROVIDER_SITE_OTHER): Payer: 59 | Admitting: Obstetrics and Gynecology

## 2021-09-30 VITALS — BP 122/74 | Ht 64.0 in | Wt 155.0 lb

## 2021-09-30 DIAGNOSIS — Z30431 Encounter for routine checking of intrauterine contraceptive device: Secondary | ICD-10-CM

## 2021-09-30 NOTE — Progress Notes (Signed)
   IUD String Check  Subjctive: [name] presents for IUD string check.  She had a Mirena placed 4 weeks ago.  Since placement of her IUD she had minimal vaginal bleeding.  She denies cramping or discomfort.  She has had intercourse since placement.  She has checked the strings.  She denies any fever, chills, nausea, vomiting, or other complaints.    Objective: BP 122/74   Ht 5\' 4"  (1.626 m)   Wt 155 lb (70.3 kg)   BMI 26.61 kg/m  Physical Exam Constitutional:      General: She is not in acute distress.    Appearance: Normal appearance.  Genitourinary:     Bladder and urethral meatus normal.     No lesions in the vagina.     Right Labia: No rash, tenderness, lesions or skin changes.    Left Labia: No tenderness, lesions, skin changes or rash.    No inguinal adenopathy present in the right or left side.    Pelvic Tanner Score: 5/5.    No vaginal discharge, erythema, bleeding or ulceration.     No vaginal prolapse present.     Right Adnexa: not tender, not full and no mass present.    Left Adnexa: not tender, not full and no mass present.    No cervical motion tenderness, discharge, friability, lesion or polyp.     IUD strings visualized (strings trimmed to 3 cm).     Uterus is not enlarged, fixed or tender.     Uterus is anteverted.  HENT:     Head: Normocephalic and atraumatic.  Eyes:     General: No scleral icterus.    Conjunctiva/sclera: Conjunctivae normal.  Lymphadenopathy:     Lower Body: No right inguinal adenopathy. No left inguinal adenopathy.  Neurological:     General: No focal deficit present.     Mental Status: She is alert and oriented to person, place, and time.     Cranial Nerves: No cranial nerve deficit.  Psychiatric:        Mood and Affect: Mood normal.        Behavior: Behavior normal.        Judgment: Judgment normal.    Female chaperone was present for the entirety of the pelvic exam  Assessment: 50 y.o. year old female status post  prior Mirena IUD placement 4 week ago, doing well.  Plan: 1.  The patient was given instructions to check her IUD strings monthly and call with any problems or concerns.  She should call for fevers, chills, abnormal vaginal discharge, pelvic pain, or other complaints. 2.  She will return for a annual exam in 1 year.  All questions answered.  22 minutes spent in face to face discussion with > 50% spent in counseling, management, and coordination of care for her newly-placed IUD.  Risks and benefits of IUD discussed including the risks of irregular bleeding, cramping, infection, malpositioning, expulsion, which may require further procedures such as laparoscopy.  IUDs while effective at preventing pregnancy do not prevent transmission of sexually transmitted diseases and use of barrier methods for this purpose was discussed.  Low overall incidence of failure with 99.7% efficacy rate in typical use.    Prentice Docker, MD 09/30/2021 10:04 AM

## 2021-10-07 NOTE — Telephone Encounter (Signed)
Mirena rcvd/charged 09/08/2021

## 2021-12-22 ENCOUNTER — Other Ambulatory Visit (HOSPITAL_COMMUNITY)
Admission: RE | Admit: 2021-12-22 | Discharge: 2021-12-22 | Disposition: A | Discharge status: Home / Self Care | Payer: Medicaid Other | Source: Ambulatory Visit | Attending: Licensed Practical Nurse | Admitting: Licensed Practical Nurse

## 2021-12-22 ENCOUNTER — Other Ambulatory Visit: Payer: Self-pay

## 2021-12-22 ENCOUNTER — Ambulatory Visit (INDEPENDENT_AMBULATORY_CARE_PROVIDER_SITE_OTHER): Payer: 59 | Admitting: Licensed Practical Nurse

## 2021-12-22 ENCOUNTER — Encounter: Payer: Self-pay | Admitting: Licensed Practical Nurse

## 2021-12-22 VITALS — BP 120/60 | Ht 64.0 in | Wt 155.0 lb

## 2021-12-22 DIAGNOSIS — Z113 Encounter for screening for infections with a predominantly sexual mode of transmission: Secondary | ICD-10-CM

## 2021-12-22 DIAGNOSIS — N898 Other specified noninflammatory disorders of vagina: Secondary | ICD-10-CM | POA: Diagnosis present

## 2021-12-22 DIAGNOSIS — R350 Frequency of micturition: Secondary | ICD-10-CM

## 2021-12-22 DIAGNOSIS — R69 Illness, unspecified: Secondary | ICD-10-CM | POA: Diagnosis not present

## 2021-12-22 DIAGNOSIS — R399 Unspecified symptoms and signs involving the genitourinary system: Secondary | ICD-10-CM | POA: Diagnosis not present

## 2021-12-22 DIAGNOSIS — L089 Local infection of the skin and subcutaneous tissue, unspecified: Secondary | ICD-10-CM | POA: Diagnosis not present

## 2021-12-22 DIAGNOSIS — N760 Acute vaginitis: Secondary | ICD-10-CM | POA: Insufficient documentation

## 2021-12-22 LAB — POCT URINALYSIS DIPSTICK
Bilirubin, UA: NEGATIVE
Blood, UA: POSITIVE
Glucose, UA: NEGATIVE
Ketones, UA: NEGATIVE
Nitrite, UA: NEGATIVE
Protein, UA: NEGATIVE
Spec Grav, UA: 1.025 (ref 1.010–1.025)
Urobilinogen, UA: 0.2 E.U./dL
pH, UA: 5 (ref 5.0–8.0)

## 2021-12-22 MED ORDER — SULFAMETHOXAZOLE-TRIMETHOPRIM 800-160 MG PO TABS: 1.0000 | ORAL_TABLET | Freq: Two times a day (BID) | ORAL | 1 refills | Status: DC

## 2021-12-22 NOTE — Progress Notes (Signed)
HPI:      [name] is a 51 y.o. [id number] who LMP was No LMP recorded. (Menstrual status: IUD)., presents today for a problem visit.    Urinary Tract Infection: Patient complains of frequency and inability to void x 1 week. . Patient also complains of  vaginal irritation/dryness . Patient denies back pain, fever, and vaginal discharge. Patient .  Patient was treated for a UTI in sept, had similar symptoms does not have a history of pyelonephritis. She hs tried cranberry juice and AZO without relief.   She has also noticed a lump along her underwear line, it has been there x 1 week, it is painful to touch. She has put warm wash clothes to the area when she is in the shower. She does shave that area.  She is sexually active, she has had 3-4 female partners in the last 3 months, she does not use condoms, she has a Mirena IUD for contraception. She desires STI screening today. Denies any changes to her discharge.   Vaginal dryness: she cannot tell if the vaginal dryness is r/t a UTI or being perimenopausal, she is starting to notice hot flashes too. She does smoke cigarettes.   PMHx: She  has a past medical history of Migraines. Also,  has a past surgical history that includes Vaginal delivery., family history includes Cancer in her maternal grandmother and maternal uncle; Early death in her brother and brother; Other in her brother and brother; Thyroid disease in her mother.,  reports that she has been smoking cigarettes. She has been smoking an average of 1 pack per day. She has never used smokeless tobacco. She reports current alcohol use. She reports that she does not use drugs.  She has a current medication list which includes the following prescription(s): ibuprofen, sulfamethoxazole-trimethoprim, levonorgestrel, and multivitamin with minerals. Also, has No Known Allergies.  Review of Systems  Constitutional:  Negative for chills and fever.  Respiratory: Negative.    Cardiovascular:  Negative.   Gastrointestinal: Negative.   Genitourinary:  Positive for frequency and urgency.       Urinates very little or not able to when she gets the urgency  Musculoskeletal: Negative.   Neurological: Negative.   Endo/Heme/Allergies:  Bruises/bleeds easily.  Psychiatric/Behavioral: Negative.     Objective: BP 120/60    Ht 5\' 4"  (1.626 m)    Wt 155 lb (70.3 kg)    BMI 26.61 kg/m  Physical Exam Constitutional:      Appearance: Normal appearance.  Genitourinary:     Vulva normal.     Genitourinary Comments: Cervix pink, no lesions, IUD strings visible, physiologic discharge present   Vulva and vaginal pale pink in color, no lesions  1cm possible blood filled area, tender to touch see diagram     Pulmonary:     Effort: Pulmonary effort is normal.     Comments: Smoker's cough during visit Neurological:     Mental Status: She is alert.    ASSESSMENT/PLAN:   Acute cystitis Skin infection  Problem List Items Addressed This Visit   None Visit Diagnoses     UTI symptoms    -  Primary   Relevant Orders   Urine Culture   Local skin infection       Relevant Medications   sulfamethoxazole-trimethoprim (BACTRIM DS) 800-160 MG tablet   Screening examination for venereal disease       Relevant Orders   HEP, RPR, HIV Panel   Hepatitis C antibody   Cervicovaginal  ancillary only   Vaginal irritation       Relevant Orders   Cervicovaginal ancillary only      STI screening: encouraged condom use  Given tender swollen area with possible infection and possible UTI will treat with Bactrim  Will call with any abnormal results  Perimenopause: rec considering vaginal moisturizer or plant based cream, lubricant for IC, given her smoking status I would try these things first, then consider estrogen therapy  if her symptoms become worse.   Roberto Scales, [place], [facility]  12/22/21  4:31 PM

## 2021-12-23 LAB — HEP, RPR, HIV PANEL
HIV Screen 4th Generation wRfx: NONREACTIVE
Hepatitis B Surface Ag: NEGATIVE
RPR Ser Ql: NONREACTIVE

## 2021-12-23 LAB — HEPATITIS C ANTIBODY: Hep C Virus Ab: NONREACTIVE

## 2021-12-24 LAB — CERVICOVAGINAL ANCILLARY ONLY
Bacterial Vaginitis (gardnerella): POSITIVE — AB
Candida Glabrata: NEGATIVE
Candida Vaginitis: NEGATIVE
Chlamydia: NEGATIVE
Comment: NEGATIVE
Comment: NEGATIVE
Comment: NEGATIVE
Comment: NEGATIVE
Comment: NEGATIVE
Comment: NORMAL
Neisseria Gonorrhea: NEGATIVE
Trichomonas: NEGATIVE

## 2021-12-25 ENCOUNTER — Other Ambulatory Visit: Payer: Self-pay | Admitting: Licensed Practical Nurse

## 2021-12-25 DIAGNOSIS — N760 Acute vaginitis: Secondary | ICD-10-CM

## 2021-12-25 DIAGNOSIS — B9689 Other specified bacterial agents as the cause of diseases classified elsewhere: Secondary | ICD-10-CM

## 2021-12-25 LAB — URINE CULTURE

## 2021-12-25 MED ORDER — CLINDAMYCIN PHOSPHATE 1 % EX GEL: Freq: Every day | CUTANEOUS | 0 refills | Status: AC

## 2021-12-26 NOTE — Progress Notes (Signed)
TC with Anderson [facility], reviewed urine culture, has started Bactrim already, feels symptoms are improving.  Reviewed swab shows BV, discussed treatment. Script sent for Clindamycin gel. Roberto Scales, CNM  [facility], [facility]  12/26/21  8:21 AM

## 2022-01-23 ENCOUNTER — Other Ambulatory Visit: Payer: Self-pay | Admitting: Licensed Practical Nurse

## 2022-01-23 DIAGNOSIS — B9689 Other specified bacterial agents as the cause of diseases classified elsewhere: Secondary | ICD-10-CM

## 2022-03-30 IMAGING — US US PELVIS COMPLETE WITH TRANSVAGINAL
1 series · 13 of 25 positions shown · non-contrast
Comparison: CT from 12/03/2015.

CLINICAL DATA: Initial evaluation for left lower quadrant pain,
left adnexal fullness.



[Series 1: us pelvis complete with transvaginal · 0.21mm/px · 13 of 91 slices shown]
[im 1/91]
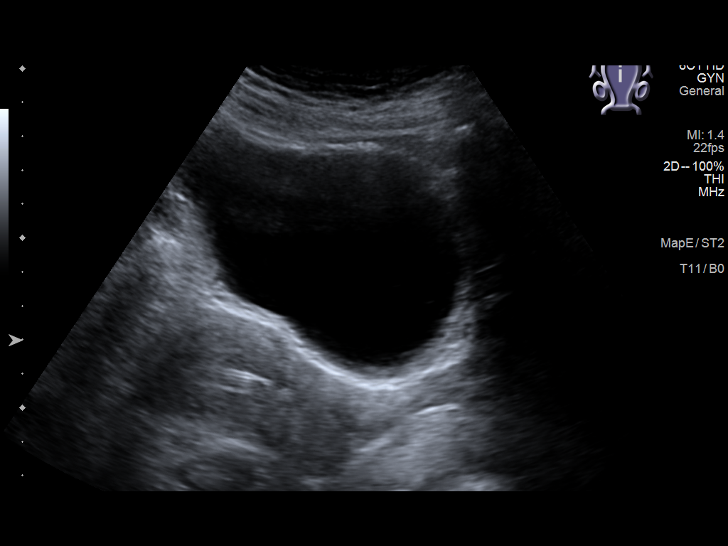
[im 8/91]
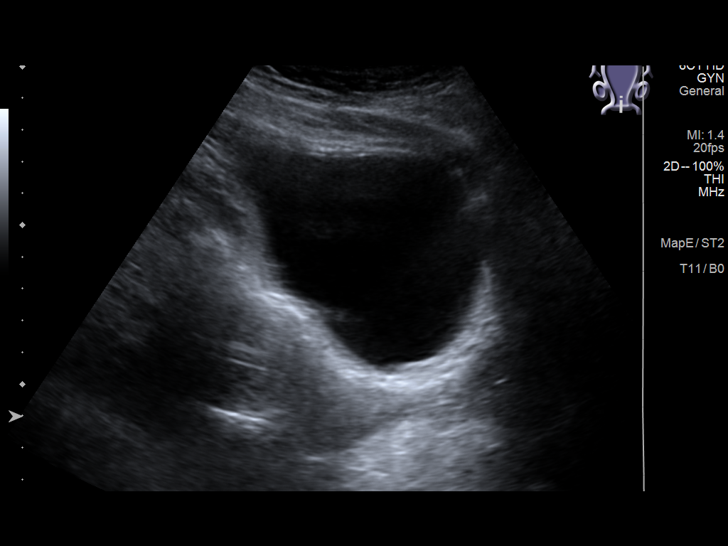
[im 16/91]
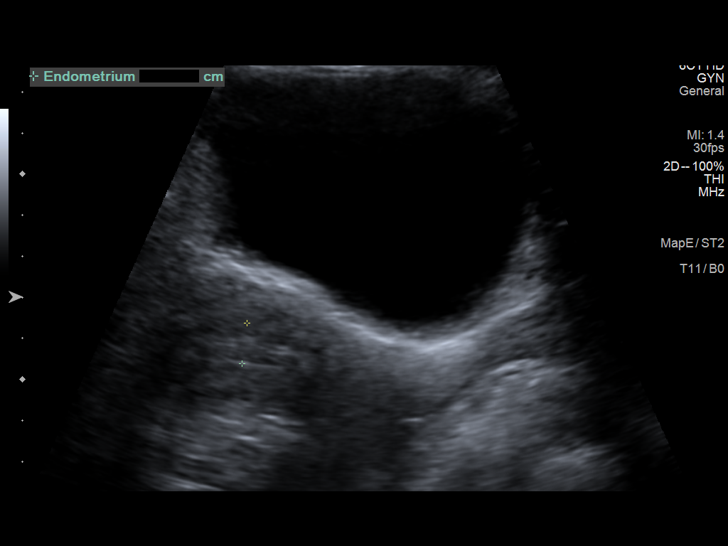
[im 23/91]
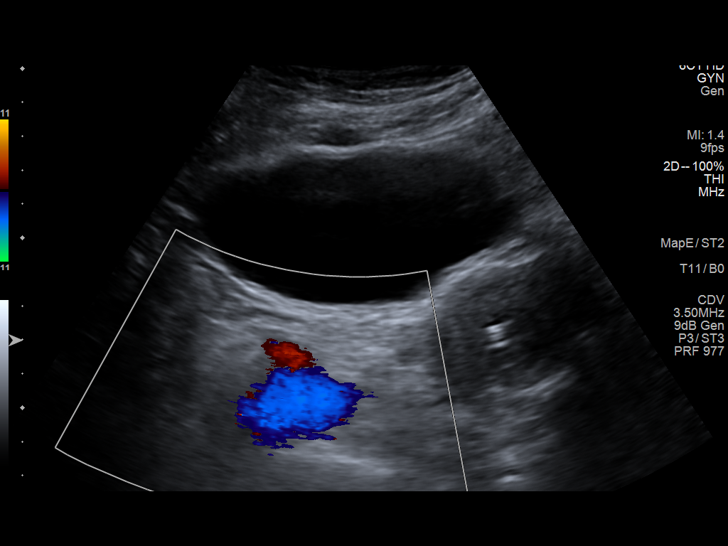
[im 31/91]
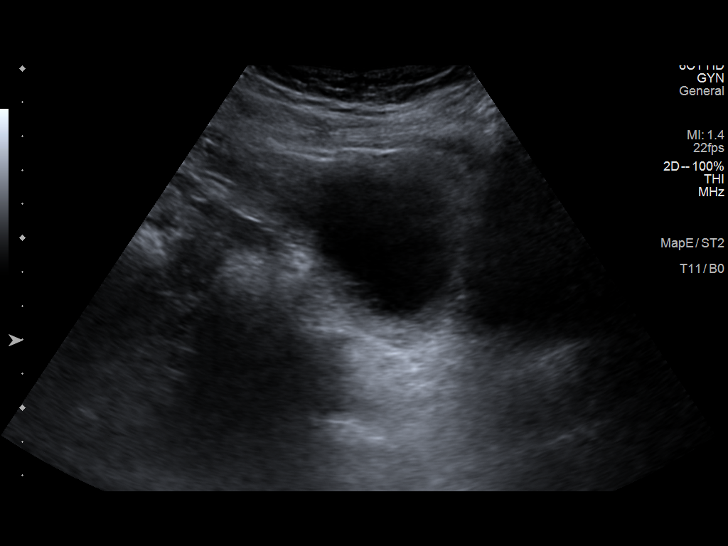
[im 38/91]
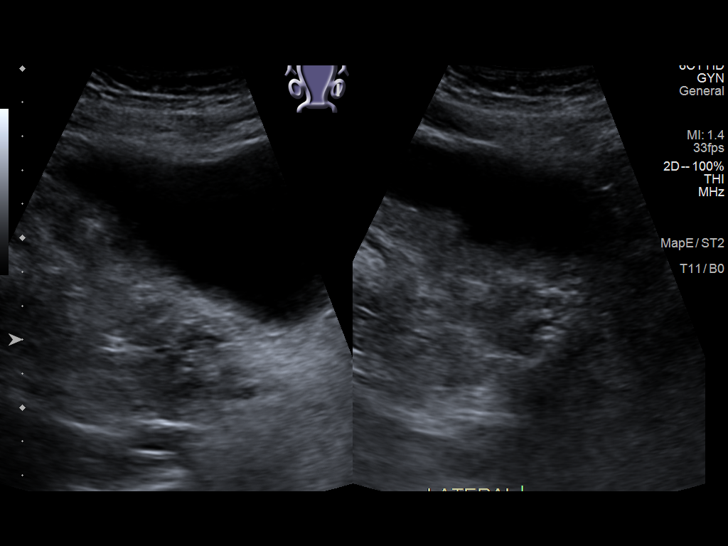
[im 46/91]
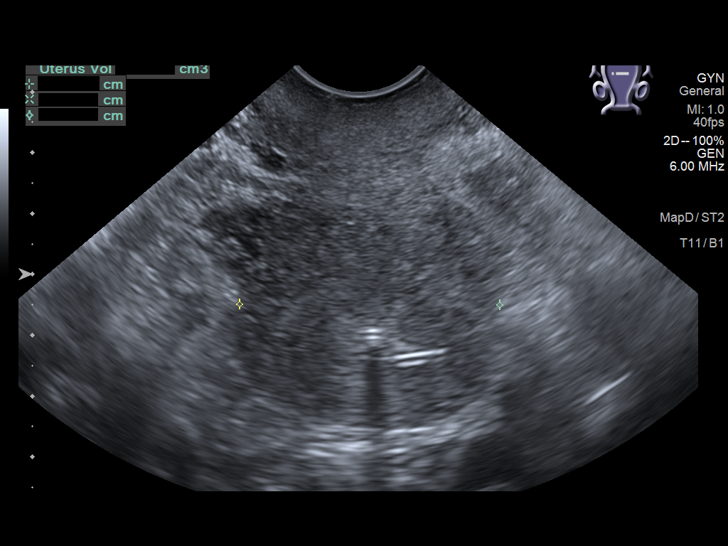
[im 53/91]
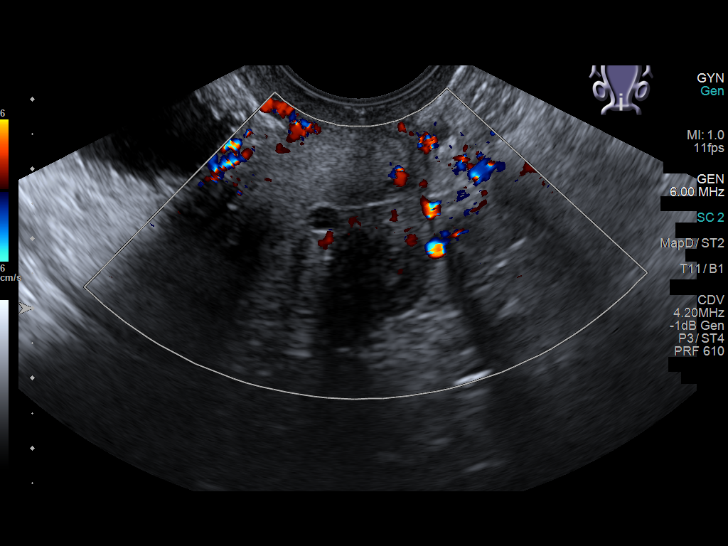
[im 61/91]
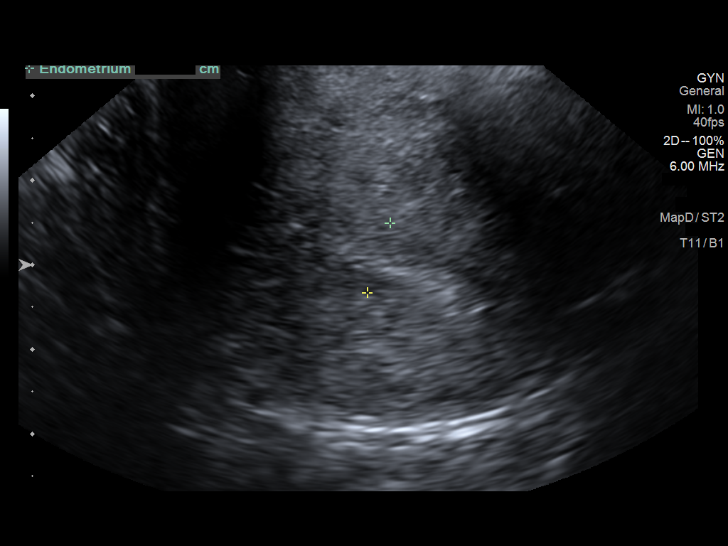
[im 68/91]
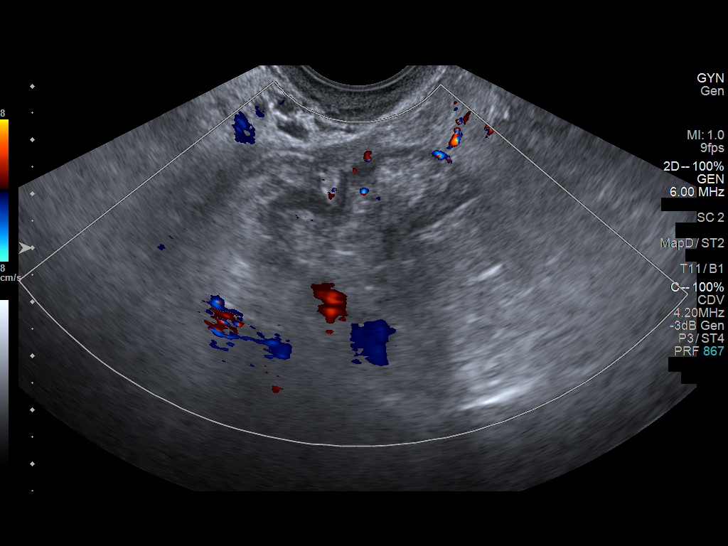
[im 76/91]
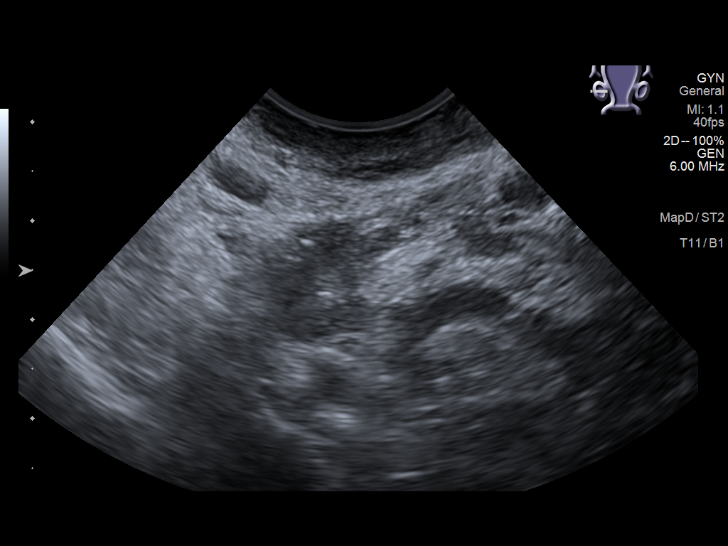
[im 83/91]
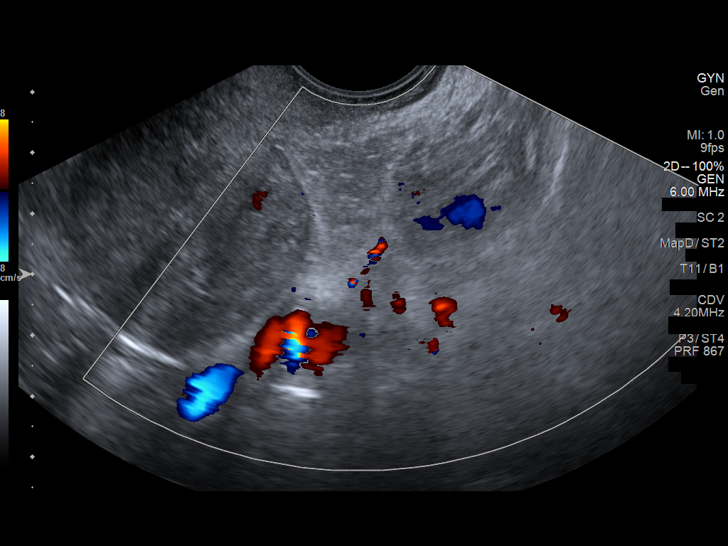
[im 91/91]
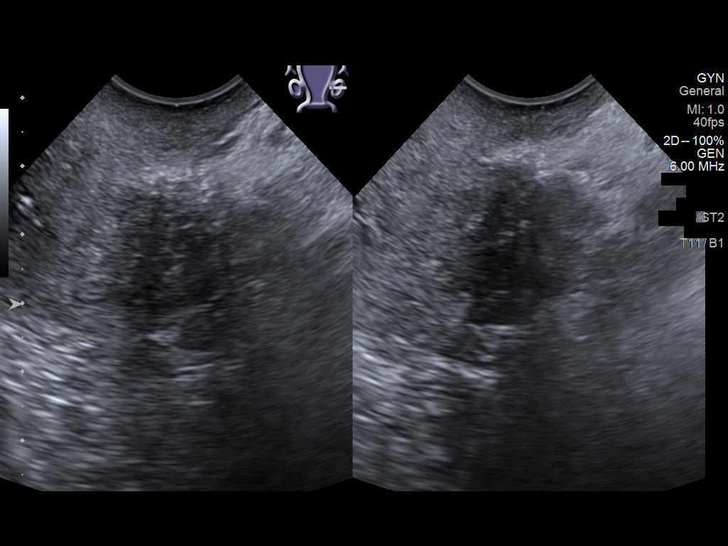

[13 of 25 positions shown; findings below may reference images not displayed]

FINDINGS: Uterus

Measurements: 6.4 x 3.2 x 4.3 cm = volume: 45.9 mL. Uterus is
retroflexed. No discrete fibroid or other mass.

Endometrium

Thickness: 8.7 mm. No focal abnormality visualized. IUD in
appropriate position within the endometrial cavity.

Right ovary

Measurements: 1.9 x 0.7 x 1.0 cm = volume: 0.7 mL. Normal
appearance/no adnexal mass.

Left ovary

Measurements: 2.2 x 1.4 x 2.2 cm = volume: 3.6 mL. Normal
appearance/no adnexal mass.

Other findings

No abnormal free fluid.
IMPRESSION: 1. Normal pelvic ultrasound. No pelvic or adnexal mass. No findings
to explain patient's symptoms.
2. IUD in appropriate position within the endometrial cavity.

## 2022-06-17 ENCOUNTER — Encounter: Payer: Self-pay | Admitting: Obstetrics & Gynecology

## 2022-06-17 ENCOUNTER — Ambulatory Visit (INDEPENDENT_AMBULATORY_CARE_PROVIDER_SITE_OTHER): Payer: 59 | Admitting: Obstetrics & Gynecology

## 2022-06-17 VITALS — BP 110/70 | Wt 143.4 lb

## 2022-06-17 DIAGNOSIS — S76211A Strain of adductor muscle, fascia and tendon of right thigh, initial encounter: Secondary | ICD-10-CM

## 2022-06-17 MED ORDER — IBUPROFEN 800 MG PO TABS: 800.0000 mg | ORAL_TABLET | Freq: Three times a day (TID) | ORAL | 1 refills | Status: DC | PRN

## 2022-06-17 NOTE — Progress Notes (Signed)
Subjective:     Veronica Stevens is a 89 Y.K.,[id number], female here for a routine exam.  Current complaints: right groin pain and exam.  Personal health questionnaire reviewed: yes.   Gynecologic History No LMP recorded. (Menstrual status: IUD). Contraception: none Last Pap: 06/05/2021. Results were: abnormal Last mammogram: undocumented.  Obstetric History OB History  Gravida Para Term Preterm AB Living  '5 4 3 1 1 4  '$ SAB IAB Ectopic Multiple Live Births  1       4    # Outcome Date GA Lbr Len/2nd Weight Sex Delivery Anes PTL Lv  5 Term 03/03/09    M Vag-Spont   LIV  4 Preterm 01/18/03    M Vag-Spont   LIV  3 SAB 03/2002          2 Term 08/29/00    M Vag-Spont   LIV  1 Term 01/13/91    M Vag-Spont   LIV     The following portions of the patient's history were reviewed and updated as appropriate: allergies, current medications, past family history, past medical history, past social history, past surgical history, and problem list.  Review of Systems A comprehensive review of systems was negative.   Musc-skel pos for rt groin strain  Objective:    BP 110/70   Wt 143 lb 6.4 oz (65 kg)   BMI 24.61 kg/m  General appearance: alert, cooperative, and no distress Abdomen: normal findings: no organomegaly and soft, non-tender Pelvic: deferred Extremities: extremities normal, atraumatic, no cyanosis or edema Skin: Skin color, texture, turgor normal. No rashes or lesions or normal, mobility and turgor normal, no edema, no lesions noted, and right groin mild tender to deep palpation      Assessment:  Right groin strain  Healthy female exam.    Plan:    Ibuprofen as ordered    RTO as scheduled.  Pt had HPV pos,LGSIL pap last yr  sched for colpo  Rosario Adie, MD  07/07/2022 2:01 AM

## 2022-07-07 ENCOUNTER — Encounter: Payer: Self-pay | Admitting: Obstetrics & Gynecology

## 2023-09-06 ENCOUNTER — Encounter: Payer: Self-pay | Admitting: Licensed Practical Nurse

## 2023-09-06 ENCOUNTER — Ambulatory Visit (INDEPENDENT_AMBULATORY_CARE_PROVIDER_SITE_OTHER): Payer: 59 | Admitting: Licensed Practical Nurse

## 2023-09-06 ENCOUNTER — Other Ambulatory Visit (HOSPITAL_COMMUNITY)
Admission: RE | Admit: 2023-09-06 | Discharge: 2023-09-06 | Disposition: A | Discharge status: Home / Self Care | Payer: 59 | Source: Ambulatory Visit | Attending: Licensed Practical Nurse | Admitting: Licensed Practical Nurse

## 2023-09-06 VITALS — BP 131/79 | HR 84 | Ht 64.0 in | Wt 163.9 lb

## 2023-09-06 DIAGNOSIS — Z01419 Encounter for gynecological examination (general) (routine) without abnormal findings: Secondary | ICD-10-CM | POA: Insufficient documentation

## 2023-09-06 DIAGNOSIS — Z1211 Encounter for screening for malignant neoplasm of colon: Secondary | ICD-10-CM

## 2023-09-06 DIAGNOSIS — Z113 Encounter for screening for infections with a predominantly sexual mode of transmission: Secondary | ICD-10-CM

## 2023-09-06 DIAGNOSIS — Z124 Encounter for screening for malignant neoplasm of cervix: Secondary | ICD-10-CM | POA: Insufficient documentation

## 2023-09-06 DIAGNOSIS — Z720 Tobacco use: Secondary | ICD-10-CM | POA: Insufficient documentation

## 2023-09-06 DIAGNOSIS — Z131 Encounter for screening for diabetes mellitus: Secondary | ICD-10-CM

## 2023-09-06 DIAGNOSIS — Z13220 Encounter for screening for lipoid disorders: Secondary | ICD-10-CM | POA: Diagnosis not present

## 2023-09-06 DIAGNOSIS — Z1231 Encounter for screening mammogram for malignant neoplasm of breast: Secondary | ICD-10-CM

## 2023-09-06 NOTE — Progress Notes (Signed)
Gynecology Annual Exam  PCP: Pcp, No  Chief Complaint:  Chief Complaint  Patient presents with   Gynecologic Exam    History of Present Illness: Patient is a 52 y.o. [id number] presents for annual exam. The patient has no complaints today. She would like STD testing.   LMP: No LMP recorded. (Menstrual status: IUD). Postcoital Bleeding: no    The patient is sexually active with 1 female. She currently uses IUD for contraception. She denies dyspareunia.  The patient does not perform self breast exams.  There is no notable family history of breast or ovarian cancer in her family.  The patient wears seatbelts: yes.   The patient has regular exercise: no.    The patient reports current symptoms of depression. "Just deals with it, or not" -Works at [employer] -Lives with her Boyfriend and 2 of her sons ages 107 and 56, feels safe -does not have PCP -last dental exam 2 years ago -Wear glasses-need eye exam -pt admits she does not follow a healthy lifestyle, she smokes almost 1 pack per day, consumes alcohol daily, denies illicit drug use, she does not have healthy eating habits   Review of Systems: Review of Systems  Endo/Heme/Allergies:        Hot flashes, infrequent   Psychiatric/Behavioral:  Positive for depression.     Past Medical History:  Patient Active Problem List   Diagnosis Date Noted Date Diagnosed   Left sided abdominal pain 01/05/2012    Constipation 01/05/2012     IMO SNOMED Dx Update Oct 2024    Chest pain at rest 01/05/2012    Left lower quadrant abdominal mass 01/05/2012     Past Surgical History:  Past Surgical History:  Procedure Laterality Date   VAGINAL DELIVERY     x4    Gynecologic History:  No LMP recorded. (Menstrual status: IUD). 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,mild dysplasia,CIN I) had colpo 9/92022 Last mammogram: 2012 Results were: BI-RAD  II  Obstetric History: [id number]  Family History:  Family History  Problem Relation Age of Onset   Cancer Maternal Grandmother        Lung & Bone - 63's   Thyroid disease Mother    Early death Brother        suicide   Early death Brother        suicide   Other Brother        Drug abuse   Other Brother        Drug abuse   Cancer Maternal Uncle        Lung - 50's   Ovarian cancer Neg Hx    Breast cancer Neg Hx     Social History:  Social History   Socioeconomic History   Marital status: Single    Spouse name: Not on file   Number of children: 4   Years of education: Not on file   Highest education level: Not on file  Occupational History   Not on file  Tobacco Use   Smoking status: Every Day    Current packs/day: 1.00    Types: Cigarettes   Smokeless tobacco: Never  Vaping Use   Vaping status: Never Used  Substance and Sexual Activity   Alcohol use: Yes    Comment: once week   Drug use: No   Sexual activity: Yes    Birth control/protection: I.U.D.  Other Topics Concern   Not on file  Social History Narrative   Lives in [place] with husband and 3 sons. Worked at [employer].      Regular Exercise -  NO   Daily Caffeine Use:  2 cups coffee            Social Determinants of Health   Financial Resource Strain: Not on file  Food Insecurity: Not on file  Transportation Needs: Not on file  Physical Activity: Not on file  Stress: Not on file  Social Connections: Not on file  Intimate Partner Violence: Not on file    Allergies:  No Known Allergies  Medications: Prior to Admission medications   Medication Sig Start Date End Date Taking? Authorizing Provider  ibuprofen (ADVIL) 800 MG tablet Take 1 tablet (800 mg total) by mouth every 8 (eight) hours as needed. 06/17/22  Yes Linzie Collin, MD  levonorgestrel (MIRENA) 20 MCG/DAY IUD 1 each by Intrauterine route once for 1 dose. 09/08/21 09/08/21  Conard Novak, MD  Multiple  Vitamins-Minerals (MULTIVITAMIN WITH MINERALS) tablet Take 1 tablet by mouth daily. Patient not taking: Reported on 12/22/2021    [provider]  sulfamethoxazole-trimethoprim (BACTRIM DS) 800-160 MG tablet Take 1 tablet by mouth 2 (two) times daily. Patient not taking: Reported on 09/06/2023 12/22/21   Ellwood Sayers, CNM    Physical Exam Vitals: Blood pressure 131/79, pulse 84, height 5\' 4"  (1.626 m), weight 163 lb 14.4 oz (74.3 kg).  General: NAD HEENT: normocephalic, anicteric Thyroid: no enlargement, no palpable nodules Pulmonary: No increased work of breathing, CTAB Cardiovascular: RRR, distal pulses 2+ Breast: Breast symmetrical, no tenderness, no palpable nodules or masses, no skin or nipple retraction present, no nipple discharge.  No axillary or supraclavicular lymphadenopathy. Abdomen: NABS, soft, non-tender, non-distended.  Umbilicus without lesions.  No hepatomegaly, splenomegaly or masses palpable. No evidence of hernia  Genitourinary:  External: Normal external female genitalia.  Normal urethral meatus, normal Bartholin's and Skene's glands.    Vagina: Normal vaginal mucosa, no evidence of prolapse.  Some tone  Cervix: Grossly normal in appearance, bleeding with pap collection  Uterus: Non-enlarged, mobile, normal contour.  No CMT  Adnexa: ovaries non-enlarged, no adnexal masses  Rectal: deferred  Lymphatic: no evidence of inguinal lymphadenopathy Extremities: no edema, erythema, or tenderness Neurologic: Grossly intact Psychiatric: mood appropriate, affect full   Assessment: 52 y.o. [id number] routine annual exam  Plan: Problem List Items Addressed This Visit   None Visit Diagnoses     Well woman exam    -  Primary   Relevant Orders   Cytology - PAP   HEP, RPR, HIV Panel   Hepatitis C antibody   CBC   Hemoglobin A1c   Lipid panel   MM DIGITAL SCREENING BILATERAL   Ambulatory referral to Gastroenterology   Cervical cancer screening        Relevant Orders   Cytology - PAP   Screening examination for venereal disease       Relevant Orders   Cytology - PAP   HEP, RPR, HIV Panel   Hepatitis C antibody   Screening for diabetes mellitus       Relevant Orders   Hemoglobin A1c   Screening for cholesterol level       Relevant Orders   Lipid panel   Encounter for screening mammogram for malignant neoplasm of breast       Relevant Orders   MM DIGITAL SCREENING BILATERAL   Colon cancer screening       Relevant Orders  Ambulatory referral to Gastroenterology       1) Mammogram - recommend yearly screening mammogram.  Mammogram Was ordered today   2) STI screening  wasoffered and accepted  3) ASCCP guidelines and rational discussed.  Patient opts for  based on results  screening interval  4) Contraception - the patient is currently using  IUD.  She is happy with her current form of contraception and plans to continue  5) Colonoscopy -- Screening recommended starting at age 59 for average risk individuals, age 48 for individuals deemed at increased risk (including African Americans) and recommended to continue until age 66.  For patient age 56-85 individualized approach is recommended.  Gold standard screening is via colonoscopy, Cologuard screening is an acceptable alternative for patient unwilling or unable to undergo colonoscopy.  "Colorectal cancer screening for average?risk adults: 2018 guideline update from the American Cancer Society"CA: A Cancer Journal for Clinicians: Mar 31, 2017   6) Routine healthcare maintenance including cholesterol, diabetes screening discussed Ordered today  7) RTC 1 year   Carie Caddy, CNM  [facility] 09/06/2023, 10:09 AM

## 2023-09-07 ENCOUNTER — Telehealth: Payer: Self-pay

## 2023-09-07 ENCOUNTER — Other Ambulatory Visit: Payer: Self-pay

## 2023-09-07 DIAGNOSIS — Z1211 Encounter for screening for malignant neoplasm of colon: Secondary | ICD-10-CM

## 2023-09-07 LAB — CBC
Hematocrit: 45.4 % (ref 34.0–46.6)
Hemoglobin: 14.9 g/dL (ref 11.1–15.9)
MCH: 32.4 pg (ref 26.6–33.0)
MCHC: 32.8 g/dL (ref 31.5–35.7)
MCV: 99 fL — ABNORMAL HIGH (ref 79–97)
Platelets: 342 10*3/uL (ref 150–450)
RBC: 4.6 x10E6/uL (ref 3.77–5.28)
RDW: 11.8 % (ref 11.7–15.4)
WBC: 6.7 10*3/uL (ref 3.4–10.8)

## 2023-09-07 LAB — HEPATITIS C ANTIBODY: Hep C Virus Ab: NONREACTIVE

## 2023-09-07 LAB — LIPID PANEL
Chol/HDL Ratio: 2.8 ratio (ref 0.0–4.4)
Cholesterol, Total: 207 mg/dL — ABNORMAL HIGH (ref 100–199)
HDL: 75 mg/dL (ref >39)
LDL Chol Calc (NIH): 116 mg/dL — ABNORMAL HIGH (ref 0–99)
Triglycerides: 91 mg/dL (ref 0–149)
VLDL Cholesterol Cal: 16 mg/dL (ref 5–40)

## 2023-09-07 LAB — HEMOGLOBIN A1C
Est. average glucose Bld gHb Est-mCnc: 111 mg/dL
Hgb A1c MFr Bld: 5.5 % (ref 4.8–5.6)

## 2023-09-07 LAB — HEP, RPR, HIV PANEL
HIV Screen 4th Generation wRfx: NONREACTIVE
Hepatitis B Surface Ag: NEGATIVE
RPR Ser Ql: NONREACTIVE

## 2023-09-07 MED ORDER — NA SULFATE-K SULFATE-MG SULF 17.5-3.13-1.6 GM/177ML PO SOLN: 1.0000 | Freq: Once | ORAL | 0 refills | Status: AC

## 2023-09-07 NOTE — Telephone Encounter (Signed)
Gastroenterology Pre-Procedure Review  Request Date: 10/04/23 Requesting Physician: Dr. Allegra Lai  PATIENT REVIEW QUESTIONS: The patient responded to the following health history questions as indicated:    1. Are you having any GI issues? no 2. Do you have a personal history of Polyps? no 3. Do you have a family history of Colon Cancer or Polyps? no 4. Diabetes Mellitus? no 5. Joint replacements in the past 12 months?no 6. Major health problems in the past 3 months?no 7. Any artificial heart valves, MVP, or defibrillator?no    MEDICATIONS & ALLERGIES:    Patient reports the following regarding taking any anticoagulation/antiplatelet therapy:   Plavix, Coumadin, Eliquis, Xarelto, Lovenox, Pradaxa, Brilinta, or Effient? no Aspirin? no  Patient confirms/reports the following medications:  Current Outpatient Medications  Medication Sig Dispense Refill   ibuprofen (ADVIL) 800 MG tablet Take 1 tablet (800 mg total) by mouth every 8 (eight) hours as needed. 60 tablet 1   levonorgestrel (MIRENA) 20 MCG/DAY IUD 1 each by Intrauterine route once for 1 dose. 1 each 0   Multiple Vitamins-Minerals (MULTIVITAMIN WITH MINERALS) tablet Take 1 tablet by mouth daily. (Patient not taking: Reported on 12/22/2021)     sulfamethoxazole-trimethoprim (BACTRIM DS) 800-160 MG tablet Take 1 tablet by mouth 2 (two) times daily. (Patient not taking: Reported on 09/06/2023) 14 tablet 1   No current facility-administered medications for this visit.    Patient confirms/reports the following allergies:  No Known Allergies  No orders of the defined types were placed in this encounter.   AUTHORIZATION INFORMATION Primary Insurance: 1D#: Group #:  Secondary Insurance: 1D#: Group #:  SCHEDULE INFORMATION: Date: 10/04/23 Time: Location: [facility]

## 2023-09-07 NOTE — Telephone Encounter (Signed)
Patient called in to schedule colonoscopy.

## 2023-09-10 LAB — CYTOLOGY - PAP
Chlamydia: NEGATIVE
Comment: NEGATIVE
Comment: NEGATIVE
Comment: NEGATIVE
Comment: NORMAL
Diagnosis: UNDETERMINED — AB
High risk HPV: NEGATIVE
Neisseria Gonorrhea: NEGATIVE
Trichomonas: NEGATIVE

## 2023-09-21 ENCOUNTER — Telehealth: Payer: Self-pay

## 2023-09-21 NOTE — Telephone Encounter (Signed)
Patient called in to cancel her colonoscopy on 10/04/23, and reschedule her colonoscopy she will be out of town. She wants to know if 10/18/23 is available.

## 2023-09-22 ENCOUNTER — Telehealth: Payer: Self-pay

## 2023-09-22 NOTE — Telephone Encounter (Signed)
Pt has requested to reschedule her 10/04/23 colonoscopy with Dr. Allegra Lai due to holidays.  She has been rescheduled to 11/29/23.  Robyn in Endo notified of new procedure date.  Referral updated.  Instructions updated.  Thanks,  Marcelino Duster CMA

## 2023-11-26 ENCOUNTER — Encounter: Payer: Self-pay | Admitting: Gastroenterology

## 2023-11-29 ENCOUNTER — Encounter
Admission: RE | Disposition: A | Discharge status: Home / Self Care | Payer: Self-pay | Source: Home / Self Care | Attending: Gastroenterology

## 2023-11-29 ENCOUNTER — Ambulatory Visit: Payer: 59 | Admitting: Certified Registered Nurse Anesthetist

## 2023-11-29 ENCOUNTER — Encounter: Payer: Self-pay | Admitting: Gastroenterology

## 2023-11-29 ENCOUNTER — Ambulatory Visit
Admission: RE | Admit: 2023-11-29 | Discharge: 2023-11-29 | Disposition: A | Discharge status: Home / Self Care | Payer: 59 | Attending: Gastroenterology | Admitting: Gastroenterology

## 2023-11-29 DIAGNOSIS — Z1211 Encounter for screening for malignant neoplasm of colon: Secondary | ICD-10-CM | POA: Diagnosis not present

## 2023-11-29 DIAGNOSIS — K5730 Diverticulosis of large intestine without perforation or abscess without bleeding: Secondary | ICD-10-CM | POA: Diagnosis not present

## 2023-11-29 HISTORY — PX: COLONOSCOPY WITH PROPOFOL: SHX5780

## 2023-11-29 SURGERY — COLONOSCOPY WITH PROPOFOL
Anesthesia: General

## 2023-11-29 MED ORDER — GLYCOPYRROLATE 0.2 MG/ML IJ SOLN
INTRAMUSCULAR | Status: AC
Start: 2023-11-29 — End: ?
  Filled 2023-11-29: qty 1

## 2023-11-29 MED ORDER — SODIUM CHLORIDE 0.9 % IV SOLN: INTRAVENOUS | Status: DC

## 2023-11-29 MED ORDER — PROPOFOL 500 MG/50ML IV EMUL
INTRAVENOUS | Status: DC | PRN
  Administered 2023-11-29: 160 ug/kg/min via INTRAVENOUS

## 2023-11-29 MED ORDER — PROPOFOL 1000 MG/100ML IV EMUL
INTRAVENOUS | Status: AC
  Filled 2023-11-29: qty 300

## 2023-11-29 MED ORDER — LIDOCAINE HCL (PF) 2 % IJ SOLN
INTRAMUSCULAR | Status: AC
  Filled 2023-11-29: qty 5

## 2023-11-29 MED ORDER — PROPOFOL 10 MG/ML IV BOLUS
INTRAVENOUS | Status: DC | PRN
  Administered 2023-11-29 (×3): 20 mg via INTRAVENOUS
  Administered 2023-11-29: 60 mg via INTRAVENOUS

## 2023-11-29 NOTE — Anesthesia Postprocedure Evaluation (Signed)
Anesthesia Post Note  Patient: Veronica Stevens  Procedure(s) Performed: COLONOSCOPY WITH PROPOFOL  Patient location during evaluation: PACU Anesthesia Type: General Level of consciousness: awake and alert Pain management: pain level controlled Vital Signs Assessment: post-procedure vital signs reviewed and stable Respiratory status: spontaneous breathing, nonlabored ventilation, respiratory function stable and patient connected to nasal cannula oxygen Cardiovascular status: blood pressure returned to baseline and stable Postop Assessment: no apparent nausea or vomiting Anesthetic complications: no  No notable events documented.   Last Vitals:  Vitals:   11/29/23 0701 11/29/23 0824  BP: 128/89 106/64  Pulse: 88 78  Resp: 18 16  Temp: (!) 36.2 C (!) 35.7 C  SpO2: 98% 99%    Last Pain:  Vitals:   11/29/23 0824  TempSrc: Tympanic  PainSc: Asleep                 Stephanie Coup

## 2023-11-29 NOTE — Anesthesia Procedure Notes (Signed)
Date/Time: 11/29/2023 8:00 AM  Performed by: Malva Cogan, CRNAPre-anesthesia Checklist: Patient identified, Emergency Drugs available, Suction available, Patient being monitored and Timeout performed Patient Re-evaluated:Patient Re-evaluated prior to induction Oxygen Delivery Method: Nasal cannula Induction Type: IV induction Placement Confirmation: CO2 detector and positive ETCO2

## 2023-11-29 NOTE — H&P (Signed)
Arlyss Repress, MD [address]  [address]  [place], [place] [address]  Main: [phone]  Fax: [phone] Pager: [phone]  Primary Care Physician:  Patient, No Pcp Per Primary Gastroenterologist:  Dr. Arlyss Repress  Pre-Procedure History & Physical: HPI:  Veronica Stevens is a 53 y.o. female is here for an colonoscopy.   Past Medical History:  Diagnosis Date   Migraines     Past Surgical History:  Procedure Laterality Date   VAGINAL DELIVERY     x4    Prior to Admission medications   Medication Sig Start Date End Date Taking? Authorizing Provider  ibuprofen (ADVIL) 800 MG tablet Take 1 tablet (800 mg total) by mouth every 8 (eight) hours as needed. Patient not taking: Reported on 09/07/2023 06/17/22   Linzie Collin, MD  levonorgestrel (MIRENA) 20 MCG/DAY IUD 1 each by Intrauterine route once for 1 dose. 09/08/21 09/08/21  Conard Novak, MD  Multiple Vitamins-Minerals (MULTIVITAMIN WITH MINERALS) tablet Take 1 tablet by mouth daily. Patient not taking: Reported on 12/22/2021    [provider]  sulfamethoxazole-trimethoprim (BACTRIM DS) 800-160 MG tablet Take 1 tablet by mouth 2 (two) times daily. Patient not taking: Reported on 09/06/2023 12/22/21   Ellwood Sayers, CNM    Allergies as of 09/08/2023   (No Known Allergies)    Family History  Problem Relation Age of Onset   Cancer Maternal Grandmother        Lung & Bone - 81's   Thyroid disease Mother    Early death Brother        suicide   Early death Brother        suicide   Other Brother        Drug abuse   Other Brother        Drug abuse   Cancer Maternal Uncle        Lung - 50's   Ovarian cancer Neg Hx    Breast cancer Neg Hx     Social History   Socioeconomic History   Marital status: Single    Spouse name: Not on file   Number of children: 4   Years of education: Not on file   Highest education level: Not on file  Occupational History   Not on file  Tobacco  Use   Smoking status: Every Day    Current packs/day: 1.00    Types: Cigarettes   Smokeless tobacco: Never  Vaping Use   Vaping status: Never Used  Substance and Sexual Activity   Alcohol use: Yes    Comment: once week   Drug use: No   Sexual activity: Yes    Birth control/protection: I.U.D.  Other Topics Concern   Not on file  Social History Narrative   Lives in [place] with husband and 3 sons. Worked at [employer].      Regular Exercise -  NO   Daily Caffeine Use:  2 cups coffee            Social Drivers of Corporate investment banker Strain: Not on file  Food Insecurity: Not on file  Transportation Needs: Not on file  Physical Activity: Not on file  Stress: Not on file  Social Connections: Not on file  Intimate Partner Violence: Not on file    Review of Systems: See HPI, otherwise negative ROS  Physical Exam: BP 128/89   Pulse 88   Temp (!) 97.1 F (36.2 C) (Temporal)   Resp 18  Ht 5\' 4"  (1.626 m)   Wt 76.4 kg   LMP  (LMP Unknown) Comment: no periods for several years  SpO2 98%   BMI 28.90 kg/m  General:   Alert,  pleasant and cooperative in NAD Head:  Normocephalic and atraumatic. Neck:  Supple; no masses or thyromegaly. Lungs:  Clear throughout to auscultation.    Heart:  Regular rate and rhythm. Abdomen:  Soft, nontender and nondistended. Normal bowel sounds, without guarding, and without rebound.   Neurologic:  Alert and  oriented x4;  grossly normal neurologically.  Impression/Plan: Monzerrath Mcburney is here for an colonoscopy to be performed for colon cancer screening  Risks, benefits, limitations, and alternatives regarding  colonoscopy have been reviewed with the patient.  Questions have been answered.  All parties agreeable.   Lannette Donath, MD  11/29/2023, 7:54 AM

## 2023-11-29 NOTE — Anesthesia Preprocedure Evaluation (Signed)
Anesthesia Evaluation  Patient identified by MRN, date of birth, ID band Patient awake    Reviewed: Allergy & Precautions, NPO status , Patient's Chart, lab work & pertinent test results  Airway Mallampati: III  TM Distance: >3 FB Neck ROM: full    Dental  (+) Chipped, Dental Advidsory Given   Pulmonary neg pulmonary ROS, Current Smoker   Pulmonary exam normal        Cardiovascular negative cardio ROS Normal cardiovascular exam     Neuro/Psych negative neurological ROS  negative psych ROS   GI/Hepatic negative GI ROS, Neg liver ROS,,,  Endo/Other  negative endocrine ROS    Renal/GU negative Renal ROS  negative genitourinary   Musculoskeletal   Abdominal   Peds  Hematology negative hematology ROS (+)   Anesthesia Other Findings Past Medical History: No date: Migraines  Past Surgical History: No date: VAGINAL DELIVERY     Comment:  x4  BMI    Body Mass Index: 28.90 kg/m      Reproductive/Obstetrics negative OB ROS                             Anesthesia Physical Anesthesia Plan  ASA: 2  Anesthesia Plan: General   Post-op Pain Management: Minimal or no pain anticipated   Induction: Intravenous  PONV Risk Score and Plan: 3 and Propofol infusion, TIVA and Ondansetron  Airway Management Planned: Nasal Cannula  Additional Equipment: None  Intra-op Plan:   Post-operative Plan:   Informed Consent: I have reviewed the patients History and Physical, chart, labs and discussed the procedure including the risks, benefits and alternatives for the proposed anesthesia with the patient or authorized representative who has indicated his/her understanding and acceptance.     Dental advisory given  Plan Discussed with: CRNA and Surgeon  Anesthesia Plan Comments: (Discussed risks of anesthesia with patient, including possibility of difficulty with spontaneous ventilation under  anesthesia necessitating airway intervention, PONV, and rare risks such as cardiac or respiratory or neurological events, and allergic reactions. Discussed the role of CRNA in patient's perioperative care. Patient understands.)       Anesthesia Quick Evaluation

## 2023-11-29 NOTE — Transfer of Care (Signed)
Immediate Anesthesia Transfer of Care Note  Patient: Veronica Stevens  Procedure(s) Performed: COLONOSCOPY WITH PROPOFOL  Patient Location: PACU  Anesthesia Type:General  Level of Consciousness: drowsy  Airway & Oxygen Therapy: Patient Spontanous Breathing  Post-op Assessment: Report given to RN and Post -op Vital signs reviewed and stable  Post vital signs: Reviewed and stable  Last Vitals:  Vitals Value Taken Time  BP    Temp    Pulse 79 11/29/23 0824  Resp 16 11/29/23 0824  SpO2 98 % 11/29/23 0824  Vitals shown include unfiled device data.  Last Pain:  Vitals:   11/29/23 0701  TempSrc: Temporal         Complications: No notable events documented.

## 2023-11-29 NOTE — Op Note (Signed)
[facility] Gastroenterology Patient Name: Veronica Stevens Procedure Date: 11/29/2023 7:09 AM MRN: [id number] Account #: [id number] Date of Birth: May 09, 1971 Admit Type: Outpatient Age: 53 Room: [facility] ENDO ROOM 4 Gender: Female Note Status: Finalized Instrument Name: Prentice Docker [id number] Procedure:             Colonoscopy Indications:           Screening for colorectal malignant neoplasm, This is                         the patient's first colonoscopy Providers:             Toney Reil MD, MD Referring MD:          No Local Md, MD (Referring MD) Medicines:             General Anesthesia Complications:         No immediate complications. Procedure:             Pre-Anesthesia Assessment:                        - Prior to the procedure, a History and Physical was                         performed, and patient medications and allergies were                         reviewed. The patient is competent. The risks and                         benefits of the procedure and the sedation options and                         risks were discussed with the patient. All questions                         were answered and informed consent was obtained.                         Patient identification and proposed procedure were                         verified by the physician, the nurse, the                         anesthesiologist, the anesthetist and the technician                         in the pre-procedure area in the procedure room in the                         endoscopy suite. Mental Status Examination: alert and                         oriented. Airway Examination: normal oropharyngeal                         airway and neck mobility. Respiratory Examination:  clear to auscultation. CV Examination: normal.                         Prophylactic Antibiotics: The patient does not require                         prophylactic antibiotics. Prior  Anticoagulants: The                         patient has taken no anticoagulant or antiplatelet                         agents. ASA Grade Assessment: II - A patient with mild                         systemic disease. After reviewing the risks and                         benefits, the patient was deemed in satisfactory                         condition to undergo the procedure. The anesthesia                         plan was to use general anesthesia. Immediately prior                         to administration of medications, the patient was                         re-assessed for adequacy to receive sedatives. The                         heart rate, respiratory rate, oxygen saturations,                         blood pressure, adequacy of pulmonary ventilation, and                         response to care were monitored throughout the                         procedure. The physical status of the patient was                         re-assessed after the procedure.                        After obtaining informed consent, the colonoscope was                         passed under direct vision. Throughout the procedure,                         the patient's blood pressure, pulse, and oxygen                         saturations were monitored continuously. The  Colonoscope was introduced through the anus and                         advanced to the the cecum, identified by appendiceal                         orifice and ileocecal valve. The colonoscopy was                         performed without difficulty. The patient tolerated                         the procedure well. The quality of the bowel                         preparation was evaluated using the BBPS [facility] Bowel                         Preparation Scale) with scores of: Right Colon = 3,                         Transverse Colon = 3 and Left Colon = 3 (entire mucosa                         seen well with no residual  staining, small fragments                         of stool or opaque liquid). The total BBPS score                         equals 9. The ileocecal valve, appendiceal orifice,                         and rectum were photographed. Findings:      The perianal and digital rectal examinations were normal. Pertinent       negatives include normal sphincter tone and no palpable rectal lesions.      Multiple medium-mouthed diverticula were found in the recto-sigmoid       colon and sigmoid colon.      The retroflexed view of the distal rectum and anal verge was normal and       showed no anal or rectal abnormalities.      The exam was otherwise without abnormality. Impression:            - Diverticulosis in the recto-sigmoid colon and in the                         sigmoid colon.                        - The distal rectum and anal verge are normal on                         retroflexion view.                        - The examination was otherwise normal.                        -  No specimens collected. Recommendation:        - Discharge patient to home (with escort).                        - Resume previous diet today.                        - Continue present medications.                        - Repeat colonoscopy in 10 years for screening                         purposes. Procedure Code(s):     --- Professional ---                        Z6109, Colorectal cancer screening; colonoscopy on                         individual not meeting criteria for high risk Diagnosis Code(s):     --- Professional ---                        Z12.11, Encounter for screening for malignant neoplasm                         of colon                        K57.30, Diverticulosis of large intestine without                         perforation or abscess without bleeding CPT copyright 2022 American Medical Association. All rights reserved. The codes documented in this report are preliminary and upon coder review may   be revised to meet current compliance requirements. Dr. Libby Maw Toney Reil MD, MD 11/29/2023 8:22:57 AM This report has been signed electronically. Number of Addenda: 0 Note Initiated On: 11/29/2023 7:09 AM Scope Withdrawal Time: 0 hours 9 minutes 49 seconds  Total Procedure Duration: 0 hours 17 minutes 22 seconds  Estimated Blood Loss:  Estimated blood loss: none.      [facility]

## 2023-11-30 ENCOUNTER — Encounter: Payer: Self-pay | Admitting: Gastroenterology

## 2024-03-07 ENCOUNTER — Emergency Department
Admission: EM | Admit: 2024-03-07 | Discharge: 2024-03-07 | Disposition: A | Discharge status: Home / Self Care | Attending: Emergency Medicine | Admitting: Emergency Medicine

## 2024-03-07 ENCOUNTER — Emergency Department

## 2024-03-07 ENCOUNTER — Other Ambulatory Visit: Payer: Self-pay

## 2024-03-07 ENCOUNTER — Encounter: Payer: Self-pay | Admitting: *Deleted

## 2024-03-07 DIAGNOSIS — K5732 Diverticulitis of large intestine without perforation or abscess without bleeding: Secondary | ICD-10-CM | POA: Diagnosis not present

## 2024-03-07 DIAGNOSIS — K5792 Diverticulitis of intestine, part unspecified, without perforation or abscess without bleeding: Secondary | ICD-10-CM

## 2024-03-07 DIAGNOSIS — D1803 Hemangioma of intra-abdominal structures: Secondary | ICD-10-CM | POA: Insufficient documentation

## 2024-03-07 DIAGNOSIS — K769 Liver disease, unspecified: Secondary | ICD-10-CM | POA: Diagnosis not present

## 2024-03-07 DIAGNOSIS — R1032 Left lower quadrant pain: Secondary | ICD-10-CM

## 2024-03-07 DIAGNOSIS — Z87891 Personal history of nicotine dependence: Secondary | ICD-10-CM | POA: Diagnosis not present

## 2024-03-07 LAB — CBC
HCT: 39.5 % (ref 36.0–46.0)
Hemoglobin: 13.7 g/dL (ref 12.0–15.0)
MCH: 33 pg (ref 26.0–34.0)
MCHC: 34.7 g/dL (ref 30.0–36.0)
MCV: 95.2 fL (ref 80.0–100.0)
Platelets: 324 10*3/uL (ref 150–400)
RBC: 4.15 MIL/uL (ref 3.87–5.11)
RDW: 12.3 % (ref 11.5–15.5)
WBC: 8.8 10*3/uL (ref 4.0–10.5)
nRBC: 0 % (ref 0.0–0.2)

## 2024-03-07 LAB — COMPREHENSIVE METABOLIC PANEL WITH GFR
ALT: 25 U/L (ref 0–44)
AST: 20 U/L (ref 15–41)
Albumin: 4 g/dL (ref 3.5–5.0)
Alkaline Phosphatase: 85 U/L (ref 38–126)
Anion gap: 10 (ref 5–15)
BUN: 12 mg/dL (ref 6–20)
CO2: 23 mmol/L (ref 22–32)
Calcium: 9.4 mg/dL (ref 8.9–10.3)
Chloride: 105 mmol/L (ref 98–111)
Creatinine, Ser: 0.67 mg/dL (ref 0.44–1.00)
GFR, Estimated: >60 mL/min (ref >60)
Glucose, Bld: 100 mg/dL — ABNORMAL HIGH (ref 70–99)
Potassium: 4.3 mmol/L (ref 3.5–5.1)
Sodium: 138 mmol/L (ref 135–145)
Total Bilirubin: 1.2 mg/dL (ref 0.0–1.2)
Total Protein: 7.2 g/dL (ref 6.5–8.1)

## 2024-03-07 LAB — URINALYSIS, ROUTINE W REFLEX MICROSCOPIC
Bilirubin Urine: NEGATIVE
Glucose, UA: NEGATIVE mg/dL
Ketones, ur: NEGATIVE mg/dL
Leukocytes,Ua: NEGATIVE
Nitrite: NEGATIVE
Protein, ur: NEGATIVE mg/dL
Specific Gravity, Urine: 1.003 — ABNORMAL LOW (ref 1.005–1.030)
pH: 6 (ref 5.0–8.0)

## 2024-03-07 LAB — LIPASE, BLOOD: Lipase: 35 U/L (ref 11–51)

## 2024-03-07 MED ORDER — AMOXICILLIN-POT CLAVULANATE 875-125 MG PO TABS: 1.0000 | ORAL_TABLET | Freq: Two times a day (BID) | ORAL | 0 refills | Status: AC

## 2024-03-07 MED ORDER — AMOXICILLIN-POT CLAVULANATE 875-125 MG PO TABS
1.0000 | ORAL_TABLET | Freq: Once | ORAL | Status: AC
  Administered 2024-03-07: 1 via ORAL
  Filled 2024-03-07: qty 1

## 2024-03-07 MED ORDER — IOHEXOL 300 MG/ML  SOLN
100.0000 mL | Freq: Once | INTRAMUSCULAR | Status: AC | PRN
  Administered 2024-03-07: 100 mL via INTRAVENOUS

## 2024-03-07 NOTE — ED Provider Notes (Signed)
 [facility] Provider Note    Event Date/Time   First MD Initiated Contact with Patient 03/07/24 2044     (approximate)   History   Abdominal Pain   HPI  Veronica Stevens is a 53 y.o. female with history of migraines, smoking history, presenting with left lower quadrant abdominal pain.  States that started about a week ago, started as epigastric abdominal pain that was associated nausea or vomiting, she thought was a stomach bug.  States that nausea vomiting had resolved, pain is now in the left lower quadrant.  Also having some left lower back aching.  No trauma or falls.  She denies any nausea, vomiting, diarrhea, vaginal discharge or bleeding, urinary symptoms, hematuria.  No fevers.  No prior history of kidney stones.   On independent chart review, she was seen by GI in January for colonoscopy, was found to have diverticulosis in the rectosigmoid colon in the sigmoid colon.  Physical Exam   Triage Vital Signs: ED Triage Vitals  Encounter Vitals Group     BP 03/07/24 1640 (!) 151/86     Systolic BP Percentile --      Diastolic BP Percentile --      Pulse Rate 03/07/24 1640 84     Resp 03/07/24 1640 17     Temp 03/07/24 1640 99.4 F (37.4 C)     Temp Source 03/07/24 1640 Oral     SpO2 03/07/24 1640 99 %     Weight 03/07/24 1643 171 lb (77.6 kg)     Height 03/07/24 1643 5\' 4"  (1.626 m)     Head Circumference --      Peak Flow --      Pain Score 03/07/24 1643 8     Pain Loc --      Pain Education --      Exclude from Growth Chart --     Most recent vital signs: Vitals:   03/07/24 1640 03/07/24 2105  BP: (!) 151/86 (!) 130/92  Pulse: 84 78  Resp: 17 18  Temp: 99.4 F (37.4 C) 99 F (37.2 C)  SpO2: 99% 98%     General: Awake, no distress.  CV:  Good peripheral perfusion.  Resp:  Normal effort.  Abd:  No distention.  Soft, tender in the left lower quadrant without guarding Other:  No CVA tenderness, no overlying rash, she does have  mild tenderness to the left paralumbar region.   ED Results / Procedures / Treatments   Labs (all labs ordered are listed, but only abnormal results are displayed) Labs Reviewed  COMPREHENSIVE METABOLIC PANEL WITH GFR - Abnormal; Notable for the following components:      Result Value   Glucose, Bld 100 (*)    All other components within normal limits  URINALYSIS, ROUTINE W REFLEX MICROSCOPIC - Abnormal; Notable for the following components:   Color, Urine STRAW (*)    APPearance CLEAR (*)    Specific Gravity, Urine 1.003 (*)    Hgb urine dipstick SMALL (*)    Bacteria, UA RARE (*)    All other components within normal limits  LIPASE, BLOOD  CBC    RADIOLOGY On my independent interpretation, CT abdomen pelvis without obvious free air   PROCEDURES:  Critical Care performed: No  Procedures   MEDICATIONS ORDERED IN ED: Medications  iohexol  (OMNIPAQUE ) 300 MG/ML solution 100 mL (100 mLs Intravenous Contrast Given 03/07/24 2123)  amoxicillin-clavulanate (AUGMENTIN) 875-125 MG per tablet 1 tablet (1 tablet Oral  Given 03/07/24 2155)     IMPRESSION / MDM / ASSESSMENT AND PLAN / ED COURSE  I reviewed the triage vital signs and the nursing notes.                              Differential diagnosis includes, but is not limited to, diverticulitis, colitis, gastroenteritis, viral illness, nephrolithiasis, UTI.  Will get labs, UA, CT abdomen pelvis.  Patient's presentation is most consistent with acute presentation with potential threat to life or bodily function.  Independent interpretation of labs and imaging below.  On reassessment patient's pain is controlled, discussed with her about CT imaging and lab results including incidental findings, will start her on antibiotics for uncomplicated diverticulitis.  First dose of Augmentin will be given in the emergency department.  Will give her a prescription for 10 days of Augmentin.  Instructed her to follow-up with her primary care  doctor for further management of her symptoms as well as reassessment.  Considered but no indication for inpatient admission at this time, she is safe for outpatient management.  Will discharge with strict return precautions.  Shared decision making to patient and she is agreeable with the plan.    Clinical Course as of 03/07/24 2204  Tue Mar 07, 2024  2047 Independent review of labs, no leukocytosis, electrolytes not severely deranged, LFTs are normal, lipase is normal, UA is not consistent with UTI. [TT]  2151 CT ABDOMEN PELVIS W CONTRAST IMPRESSION: Mild uncomplicated diverticulitis in the sigmoid colon.  Stable hemangioma in the right lobe of the liver.  No other focal abnormality is noted.   [TT]    Clinical Course User Index [TT] Shane Darling, MD     FINAL CLINICAL IMPRESSION(S) / ED DIAGNOSES   Final diagnoses:  Diverticulitis  Left lower quadrant abdominal pain  Hemangioma of intra-abdominal structure     Rx / DC Orders   ED Discharge Orders          Ordered    amoxicillin-clavulanate (AUGMENTIN) 875-125 MG tablet  2 times daily        03/07/24 2203             Note:  This document was prepared using Dragon voice recognition software and may include unintentional dictation errors.    Shane Darling, MD 03/07/24 [phone]

## 2024-03-07 NOTE — Discharge Instructions (Addendum)
 Please make sure to follow-up with your primary care doctor this week for further management of your symptoms.  Please take the antibiotics as prescribed for diverticulitis.

## 2024-03-07 NOTE — ED Triage Notes (Signed)
 First Nurse Note: Patient to ED from [facility] for abd pain- LLQ. Ongoing x1 week. Tender to touch. Radiates to lower back. Denies N/V/D. Hx diverticulosis.

## 2024-03-07 NOTE — ED Triage Notes (Signed)
 Pt ambulatory to triage.  Pt sent from [facility] for eval of left lower abd pain.  Pt reports pain in lower back also.  No n/v/d   denies urinary sx.  Pt alert  speech clear

## 2024-04-03 DIAGNOSIS — Z8719 Personal history of other diseases of the digestive system: Secondary | ICD-10-CM | POA: Diagnosis not present

## 2024-04-03 DIAGNOSIS — Z1283 Encounter for screening for malignant neoplasm of skin: Secondary | ICD-10-CM | POA: Diagnosis not present

## 2024-04-03 DIAGNOSIS — E7800 Pure hypercholesterolemia, unspecified: Secondary | ICD-10-CM | POA: Diagnosis not present

## 2024-04-03 DIAGNOSIS — F1010 Alcohol abuse, uncomplicated: Secondary | ICD-10-CM | POA: Diagnosis not present

## 2024-04-03 DIAGNOSIS — F1990 Other psychoactive substance use, unspecified, uncomplicated: Secondary | ICD-10-CM | POA: Diagnosis not present

## 2024-04-03 DIAGNOSIS — M5441 Lumbago with sciatica, right side: Secondary | ICD-10-CM | POA: Diagnosis not present

## 2024-04-03 DIAGNOSIS — Z1331 Encounter for screening for depression: Secondary | ICD-10-CM | POA: Diagnosis not present

## 2024-04-03 DIAGNOSIS — Z0000 Encounter for general adult medical examination without abnormal findings: Secondary | ICD-10-CM | POA: Diagnosis not present

## 2024-04-03 DIAGNOSIS — K5904 Chronic idiopathic constipation: Secondary | ICD-10-CM | POA: Diagnosis not present

## 2024-04-03 DIAGNOSIS — Z1231 Encounter for screening mammogram for malignant neoplasm of breast: Secondary | ICD-10-CM | POA: Diagnosis not present

## 2024-04-03 DIAGNOSIS — Z720 Tobacco use: Secondary | ICD-10-CM | POA: Diagnosis not present

## 2024-04-03 DIAGNOSIS — N951 Menopausal and female climacteric states: Secondary | ICD-10-CM | POA: Diagnosis not present

## 2024-04-03 DIAGNOSIS — E66811 Obesity, class 1: Secondary | ICD-10-CM | POA: Diagnosis not present

## 2024-07-10 DIAGNOSIS — F1010 Alcohol abuse, uncomplicated: Secondary | ICD-10-CM | POA: Diagnosis not present

## 2024-07-10 DIAGNOSIS — M25511 Pain in right shoulder: Secondary | ICD-10-CM | POA: Diagnosis not present

## 2024-07-10 DIAGNOSIS — E66811 Obesity, class 1: Secondary | ICD-10-CM | POA: Diagnosis not present

## 2024-07-10 DIAGNOSIS — F329 Major depressive disorder, single episode, unspecified: Secondary | ICD-10-CM | POA: Diagnosis not present

## 2024-07-10 DIAGNOSIS — G8929 Other chronic pain: Secondary | ICD-10-CM | POA: Diagnosis not present

## 2024-07-10 DIAGNOSIS — Z09 Encounter for follow-up examination after completed treatment for conditions other than malignant neoplasm: Secondary | ICD-10-CM | POA: Diagnosis not present

## 2024-07-19 DIAGNOSIS — L720 Epidermal cyst: Secondary | ICD-10-CM | POA: Diagnosis not present

## 2024-07-19 DIAGNOSIS — L728 Other follicular cysts of the skin and subcutaneous tissue: Secondary | ICD-10-CM | POA: Diagnosis not present

## 2024-07-24 DIAGNOSIS — F331 Major depressive disorder, recurrent, moderate: Secondary | ICD-10-CM | POA: Diagnosis not present

## 2024-07-31 DIAGNOSIS — H02824 Cysts of left upper eyelid: Secondary | ICD-10-CM | POA: Diagnosis not present

## 2024-07-31 DIAGNOSIS — F331 Major depressive disorder, recurrent, moderate: Secondary | ICD-10-CM | POA: Diagnosis not present

## 2024-08-03 ENCOUNTER — Other Ambulatory Visit: Payer: Self-pay

## 2024-08-03 ENCOUNTER — Emergency Department

## 2024-08-03 ENCOUNTER — Emergency Department
Admission: EM | Admit: 2024-08-03 | Discharge: 2024-08-03 | Disposition: A | Discharge status: Home / Self Care | Attending: Emergency Medicine | Admitting: Emergency Medicine

## 2024-08-03 DIAGNOSIS — S51851A Open bite of right forearm, initial encounter: Secondary | ICD-10-CM | POA: Insufficient documentation

## 2024-08-03 DIAGNOSIS — Z23 Encounter for immunization: Secondary | ICD-10-CM | POA: Diagnosis not present

## 2024-08-03 DIAGNOSIS — R600 Localized edema: Secondary | ICD-10-CM | POA: Diagnosis not present

## 2024-08-03 DIAGNOSIS — S51811A Laceration without foreign body of right forearm, initial encounter: Secondary | ICD-10-CM | POA: Diagnosis not present

## 2024-08-03 DIAGNOSIS — W540XXA Bitten by dog, initial encounter: Secondary | ICD-10-CM | POA: Insufficient documentation

## 2024-08-03 MED ORDER — TETANUS-DIPHTH-ACELL PERTUSSIS 5-2-15.5 LF-MCG/0.5 IM SUSP
0.5000 mL | Freq: Once | INTRAMUSCULAR | Status: AC
  Administered 2024-08-03: 0.5 mL via INTRAMUSCULAR

## 2024-08-03 MED ORDER — BACITRACIN 500 UNIT/GM EX OINT: 1.0000 | TOPICAL_OINTMENT | Freq: Two times a day (BID) | CUTANEOUS | 0 refills | Status: AC

## 2024-08-03 MED ORDER — LIDOCAINE-EPINEPHRINE (PF) 2 %-1:200000 IJ SOLN
INTRAMUSCULAR | Status: AC
  Administered 2024-08-03: 20 mL
  Filled 2024-08-03: qty 20

## 2024-08-03 MED ORDER — AMOXICILLIN-POT CLAVULANATE 875-125 MG PO TABS
1.0000 | ORAL_TABLET | Freq: Once | ORAL | Status: AC
  Administered 2024-08-03: 1 via ORAL
  Filled 2024-08-03: qty 1

## 2024-08-03 MED ORDER — LIDOCAINE-EPINEPHRINE 2 %-1:100000 IJ SOLN: 20.0000 mL | Freq: Once | INTRAMUSCULAR | Status: DC

## 2024-08-03 MED ORDER — AMOXICILLIN-POT CLAVULANATE 875-125 MG PO TABS: 1.0000 | ORAL_TABLET | Freq: Two times a day (BID) | ORAL | 0 refills | Status: AC

## 2024-08-03 NOTE — ED Provider Notes (Signed)
 [facility] Provider Note    Event Date/Time   First MD Initiated Contact with Patient 08/03/24 1951     (approximate)   History   Animal Bite   HPI  Veronica Stevens is a 53 y.o. female with PMH of migraines presents for evaluation of a dog bite to the back of the right forearm. Patient states she was reaching over the fence to pet her brother's dogs when one of them bit her. She is not sure when her last tetanus shot was. She does think that her brother's dogs are up to date on their vaccines.      Physical Exam   Triage Vital Signs: ED Triage Vitals  Encounter Vitals Group     BP 08/03/24 1946 (!) 132/57     Girls Systolic BP Percentile --      Girls Diastolic BP Percentile --      Boys Systolic BP Percentile --      Boys Diastolic BP Percentile --      Pulse Rate 08/03/24 1946 (!) 104     Resp 08/03/24 1946 18     Temp 08/03/24 1946 97.8 F (36.6 C)     Temp Source 08/03/24 1946 Oral     SpO2 08/03/24 1946 97 %     Weight 08/03/24 1948 170 lb (77.1 kg)     Height 08/03/24 1948 5' 2 (1.575 m)     Head Circumference --      Peak Flow --      Pain Score 08/03/24 1946 10     Pain Loc --      Pain Education --      Exclude from Growth Chart --     Most recent vital signs: Vitals:   08/03/24 2100 08/03/24 2113  BP: (!) 141/87   Pulse: 86   Resp:  18  Temp:  97.8 F (36.6 C)  SpO2:  99%    General: Awake, no distress.  CV:  Good peripheral perfusion.  Resp:  Normal effort.  Abd:  No distention.  Other:  3 wounds to posterior right forearm, surrounding swelling and bruising, 1 long linear laceration about 5 cm in length with subcutaneous tissue exposed, 1 angled laceration about 1 cm total in length with small amount of subcutaneous tissues, 1 angled laceration about 3 cm total in length with small amount of exposed subcutaneous tissue, bleeding is controlled.    ED Results / Procedures / Treatments   Labs (all labs ordered are  listed, but only abnormal results are displayed) Labs Reviewed - No data to display   RADIOLOGY  Right forearm xray obtained, I interpreted the images as well as reviewed the radiologist report, I do not see any underlying fractures. There is a small curvilinear foreign body about 4 mm in the mid forearm but this does not correlate with the location of the lacerations.  PROCEDURES:  Critical Care performed: No  .Laceration Repair  Date/Time: 08/03/2024 9:04 PM  Performed by: Cleaster Tinnie LABOR, PA-C Authorized by: Cleaster Tinnie LABOR, PA-C   Consent:    Consent obtained:  Verbal   Consent given by:  Patient   Risks, benefits, and alternatives were discussed: yes     Risks discussed:  Infection, pain, poor cosmetic result, poor wound healing and retained foreign body   Alternatives discussed:  No treatment Universal protocol:    Patient identity confirmed:  Verbally with patient Anesthesia:    Anesthesia method:  Local infiltration  Local anesthetic:  Lidocaine  2% WITH epi Laceration details:    Location:  Shoulder/arm   Shoulder/arm location:  R lower arm   Length (cm):  5   Depth (mm):  10 Pre-procedure details:    Preparation:  Imaging obtained to evaluate for foreign bodies Exploration:    Hemostasis achieved with:  Direct pressure   Imaging obtained: x-ray     Imaging outcome: foreign body noted (not in the location of the laceration)     Wound exploration: entire depth of wound visualized     Contaminated: no   Treatment:    Area cleansed with:  Povidone-iodine   Amount of cleaning:  Standard   Irrigation solution:  Sterile saline   Irrigation method:  Syringe Skin repair:    Repair method:  Sutures   Suture size:  5-0   Suture material:  Nylon   Suture technique:  Simple interrupted   Number of sutures:  5 Approximation:    Approximation:  Loose Repair type:    Repair type:  Simple Post-procedure details:    Dressing:  Bulky dressing   Procedure  completion:  Tolerated well, no immediate complications .Laceration Repair  Date/Time: 08/03/2024 9:06 PM  Performed by: Cleaster Tinnie LABOR, PA-C Authorized by: Cleaster Tinnie LABOR, PA-C   Consent:    Consent obtained:  Verbal   Consent given by:  Patient   Risks, benefits, and alternatives were discussed: yes     Risks discussed:  Infection, pain, poor cosmetic result, poor wound healing and retained foreign body   Alternatives discussed:  No treatment Universal protocol:    Patient identity confirmed:  Verbally with patient Anesthesia:    Anesthesia method:  Local infiltration   Local anesthetic:  Lidocaine  2% WITH epi Laceration details:    Location:  Shoulder/arm   Shoulder/arm location:  R lower arm   Length (cm):  3   Depth (mm):  5 Pre-procedure details:    Preparation:  Imaging obtained to evaluate for foreign bodies Exploration:    Hemostasis achieved with:  Direct pressure   Imaging obtained: x-ray     Imaging outcome: foreign body noted (not in the location of the laceration)     Wound exploration: entire depth of wound visualized     Contaminated: no   Treatment:    Area cleansed with:  Povidone-iodine   Amount of cleaning:  Standard   Irrigation solution:  Sterile saline   Irrigation method:  Syringe Skin repair:    Repair method:  Sutures   Suture size:  5-0   Suture material:  Nylon   Suture technique:  Simple interrupted   Number of sutures:  3 Approximation:    Approximation:  Loose Repair type:    Repair type:  Simple Post-procedure details:    Dressing:  Bulky dressing   Procedure completion:  Tolerated well, no immediate complications .Laceration Repair  Date/Time: 08/03/2024 9:08 PM  Performed by: Cleaster Tinnie LABOR, PA-C Authorized by: Cleaster Tinnie LABOR, PA-C   Consent:    Consent obtained:  Verbal   Consent given by:  Patient   Risks, benefits, and alternatives were discussed: yes     Risks discussed:  Infection, pain, poor cosmetic  result, poor wound healing and retained foreign body   Alternatives discussed:  No treatment Universal protocol:    Patient identity confirmed:  Verbally with patient Anesthesia:    Anesthesia method:  Local infiltration   Local anesthetic:  Lidocaine  2% WITH epi Laceration details:  Location:  Shoulder/arm   Shoulder/arm location:  R lower arm   Length (cm):  1   Depth (mm):  3 Pre-procedure details:    Preparation:  Imaging obtained to evaluate for foreign bodies Exploration:    Hemostasis achieved with:  Direct pressure   Imaging obtained: x-ray     Imaging outcome: foreign body noted (not in the location of the laceration)     Wound exploration: entire depth of wound visualized     Contaminated: no   Treatment:    Area cleansed with:  Povidone-iodine   Amount of cleaning:  Standard   Irrigation solution:  Sterile saline   Irrigation method:  Syringe Skin repair:    Repair method:  Sutures   Suture size:  5-0   Suture material:  Nylon   Suture technique:  Simple interrupted   Number of sutures:  1 Approximation:    Approximation:  Loose Repair type:    Repair type:  Simple Post-procedure details:    Dressing:  Bulky dressing   Procedure completion:  Tolerated well, no immediate complications    MEDICATIONS ORDERED IN ED: Medications  amoxicillin -clavulanate (AUGMENTIN ) 875-125 MG per tablet 1 tablet (has no administration in time range)  Tdap (ADACEL) injection 0.5 mL (0.5 mLs Intramuscular Given 08/03/24 2108)  lidocaine -EPINEPHrine (XYLOCAINE  W/EPI) 2 %-1:200000 (PF) injection (20 mLs  Given by Other 08/03/24 2108)     IMPRESSION / MDM / ASSESSMENT AND PLAN / ED COURSE  I reviewed the triage vital signs and the nursing notes.                             53 year old female presents for evaluation of a dog bite. BP and heart rate are a little bit elevated, VSS otherwise. Patient is uncomfortable on exam.   Differential diagnosis includes, but is not limited  to, dog bite, tetanus prophylaxis, rabies prophylaxis.   Patient's presentation is most consistent with acute complicated illness / injury requiring diagnostic workup.  Xray of the right forearm does not show any underlying fracture but does have a small retained foreign body about 4 mm in the mid forearm but this does not correlate with the location of the bites.  Patient was not up to date on her tetanus so that was given today.  She did confirm with her brother that the dogs are up-to-date on vaccinations so we did not do the rabies series.  Given that the wounds were caused by a dog bite I did explain to the patient risks of closure including infection and retained foreign body.  The wounds are gaping open and there is some subcutaneous fat exposed.  I explained to the patient that I would recommend loosely closing the wounds.  She was agreeable to this.  See procedure note for further details.  Discussed wound care.  Advised her to apply topical antibiotic ointment.  Will also start her on oral antibiotics.  Advised to follow-up with PCP on Monday for a wound check.  Reviewed signs of infection to watch for and when to return to the emergency department.  Patient voiced understanding, all questions were answered and she was stable at discharge.     FINAL CLINICAL IMPRESSION(S) / ED DIAGNOSES   Final diagnoses:  Dog bite of right upper extremity, initial encounter     Rx / DC Orders   ED Discharge Orders          Ordered  bacitracin 500 UNIT/GM ointment  2 times daily        08/03/24 2111    amoxicillin -clavulanate (AUGMENTIN ) 875-125 MG tablet  2 times daily        08/03/24 2111             Note:  This document was prepared using Dragon voice recognition software and may include unintentional dictation errors.   Cleaster Tinnie LABOR, PA-C 08/03/24 2114    Jossie Artist POUR, MD 08/03/24 2137

## 2024-08-03 NOTE — Discharge Instructions (Addendum)
 The x-ray of your right forearm did not show any fractures.  There was a 4 mm retained foreign body in your mid forearm but this did not correlate with where the lacerations were so this has likely been there for a while.  The lacerations on your arm were closed loosely with sutures today.  Because they were caused by a dog bite they are high risk for infection.  Please wash them with antibacterial soap twice a day.  Pat dry and cover with a bandage.  I have sent some topical antibiotic ointment you can apply to help prevent infection.  I have also started you on oral antibiotics to also help prevent infection.  Please follow-up with your primary care provider on Monday for a wound check.  The stitches will need to be removed in 7 to 10 days.  This can be done by primary care, urgent care or the emergency department.  Watch for signs of infection including redness, warmth, swelling, pain and pus drainage.  If you develop any of these please return to the ED, urgent care or your primary care provider.  You can take 650 mg of Tylenol and 600 mg of ibuprofen  every 6 hours as needed for pain. You can use ice, heat, muscle creams and other topical pain relievers as well.

## 2024-08-03 NOTE — ED Triage Notes (Signed)
 Patient C/O dog bite to back of right forearm. Dog belongs to patient's brother, and she does believe he's up to date on vaccinations.

## 2024-08-03 NOTE — ED Notes (Signed)
 PT in no acute distress prior to discharge. Discharged instructions reviewed, all questions answered and pt verbalized understanding at this time. Pt has all belongings with them at time of discharge.

## 2024-08-07 ENCOUNTER — Encounter

## 2024-08-14 DIAGNOSIS — Z4802 Encounter for removal of sutures: Secondary | ICD-10-CM | POA: Diagnosis not present

## 2024-08-14 DIAGNOSIS — F331 Major depressive disorder, recurrent, moderate: Secondary | ICD-10-CM | POA: Diagnosis not present

## 2024-08-28 DIAGNOSIS — F331 Major depressive disorder, recurrent, moderate: Secondary | ICD-10-CM | POA: Diagnosis not present

## 2024-09-04 ENCOUNTER — Ambulatory Visit
Admission: RE | Admit: 2024-09-04 | Discharge: 2024-09-04 | Disposition: A | Discharge status: Home / Self Care | Source: Ambulatory Visit | Attending: Licensed Practical Nurse | Admitting: Licensed Practical Nurse

## 2024-09-04 DIAGNOSIS — F331 Major depressive disorder, recurrent, moderate: Secondary | ICD-10-CM | POA: Diagnosis not present

## 2024-09-04 DIAGNOSIS — Z01419 Encounter for gynecological examination (general) (routine) without abnormal findings: Secondary | ICD-10-CM | POA: Diagnosis not present

## 2024-09-04 DIAGNOSIS — Z1231 Encounter for screening mammogram for malignant neoplasm of breast: Secondary | ICD-10-CM | POA: Insufficient documentation

## 2024-09-05 DIAGNOSIS — R208 Other disturbances of skin sensation: Secondary | ICD-10-CM | POA: Diagnosis not present

## 2024-09-05 DIAGNOSIS — L720 Epidermal cyst: Secondary | ICD-10-CM | POA: Diagnosis not present

## 2024-09-05 DIAGNOSIS — L728 Other follicular cysts of the skin and subcutaneous tissue: Secondary | ICD-10-CM | POA: Diagnosis not present

## 2024-09-05 DIAGNOSIS — L538 Other specified erythematous conditions: Secondary | ICD-10-CM | POA: Diagnosis not present

## 2024-09-18 DIAGNOSIS — F331 Major depressive disorder, recurrent, moderate: Secondary | ICD-10-CM | POA: Diagnosis not present

## 2024-10-02 DIAGNOSIS — F331 Major depressive disorder, recurrent, moderate: Secondary | ICD-10-CM | POA: Diagnosis not present

## 2024-10-09 DIAGNOSIS — F331 Major depressive disorder, recurrent, moderate: Secondary | ICD-10-CM | POA: Diagnosis not present

## 2024-10-12 NOTE — Progress Notes (Signed)
 Gynecology Annual Exam  PCP: Patient, No Pcp Per  Chief Complaint:  Chief Complaint  Patient presents with   Gynecologic Exam    History of Present Illness:Patient is a 53 y.o. Veronica Stevens presents for annual exam. 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  Trouble losing weight, tried drops that did not help, does not eat regularly   LMP: No LMP recorded. (Menstrual status: IUD). Currently IUD x 2-3 years  Menarche:13 Average Interval: has IUD  Duration of flow: N/A Heavy Menses: not applicable Clots: not applicable Intermenstrual Bleeding: not applicable Postcoital Bleeding: not applicable Dysmenorrhea: not applicable  The patient is sexually active 1 female partner. She denies dyspareunia.  The patient does not perform self breast exams.  There is no notable family history of breast or ovarian cancer in her family.  The patient wears seatbelts: yes.   The patient has regular exercise: no.    The patient denies current symptoms of depression.   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, does not have secretary/administrator Wears glasses when driving, last eye exam years ago, does not  have insurance  Calcium: was taking gummy ran out Vitamin D  Smoke cigarettes 1 pack per day, does not want to quit  Alcohol on the weekends, has decreased her intake  Denies illicit drug use   Review of Systems: ROS see HPI   Past  Medical History:  Patient Active Problem List   Diagnosis Date Noted Date Diagnosed   Encounter for screening colonoscopy 11/29/2023    Tobacco use 09/06/2023    Left sided abdominal pain 01/05/2012    Constipation 01/05/2012     IMO SNOMED Dx Update Oct 2024    Chest pain at rest 01/05/2012    Left lower quadrant abdominal mass 01/05/2012     Past Surgical History:  Past Surgical History:  Procedure Laterality Date   COLONOSCOPY WITH PROPOFOL  N/A 11/29/2023   Procedure: COLONOSCOPY WITH PROPOFOL ;  Surgeon: Unk Corinn Skiff, MD;  Location: [facility] ENDOSCOPY;  Service: Gastroenterology;  Laterality: N/A;   VAGINAL DELIVERY     x4    Gynecologic History:  No LMP recorded. (Menstrual status: IUD). Last Pap: Results were: 2024 ASCUS with NEGATIVE high risk HPV  colpo in 2022 Last mammogram: 09/2024 Results were: BI-RAD I  Obstetric History: Veronica Stevens  Family History:  Family History  Problem Relation Age of Onset   Cancer Maternal Grandmother        Lung & Bone - 41's   Thyroid disease Mother    Early death Brother        suicide   Early death Brother        suicide   Other Brother        Drug abuse   Other Brother        Drug abuse   Cancer Maternal  Uncle        Lung - 50's   Ovarian cancer Neg Hx    Breast cancer Neg Hx     Social History:  Social History   Socioeconomic History   Marital status: Single    Spouse name: Not on file   Number of children: 4   Years of education: Not on file   Highest education level: Not on file  Occupational History   Not on file  Tobacco Use   Smoking status: Every Day    Current packs/day: 1.00    Types: Cigarettes   Smokeless tobacco: Never  Vaping Use   Vaping status: Never Used  Substance and Sexual Activity   Alcohol use: Yes    Comment: once week   Drug use: No   Sexual activity: Yes    Birth control/protection: I.U.D.  Other Topics Concern   Not on file  Social History Narrative   Lives in [place] with  husband and 3 sons. Worked at [employer].      Regular Exercise -  NO   Daily Caffeine Use:  2 cups coffee            Social Drivers of Health   Tobacco Use: High Risk (08/03/2024)   Patient History    Smoking Tobacco Use: Every Day    Smokeless Tobacco Use: Never    Passive Exposure: Not on file  Financial Resource Strain: Low Risk  (04/03/2024)   Received from [facility]   Overall Financial Resource Strain (CARDIA)    Difficulty of Paying Living Expenses: Not hard at all  Food Insecurity: No Food Insecurity (04/03/2024)   Received from [facility]   Epic    Within the past 12 months, you worried that your food would run out before you got the money to buy more.: Never true    Within the past 12 months, the food you bought just didn't last and you didn't have money to get more.: Never true  Transportation Needs: No Transportation Needs (04/03/2024)   Received from [facility] - Transportation    In the past 12 months, has lack of transportation kept you from medical appointments or from getting medications?: No    Lack of Transportation (Non-Medical): No  Physical Activity: Not on file  Stress: Not on file  Social Connections: Not on file  Intimate Partner Violence: Not on file  Depression (EYV7-0): Not on file  Alcohol Screen: Not on file  Housing: Low Risk  (04/03/2024)   Received from [facility]   Epic    In the last 12 months, was there a time when you were not able to pay the mortgage or rent on time?: No    In the past 12 months, how many times have you moved where you were living?: 0    At any time in the past 12 months, were you homeless or living in a shelter (including now)?: No  Utilities: Not At Risk (04/03/2024)   Received from [facility] Utilities    Threatened with loss of utilities: No  Health Literacy: Not on file    Allergies:   Allergies[1]  Medications: Prior to Admission medications  Medication Sig Start Date End Date Taking? Authorizing Provider  bacitracin  500 UNIT/GM ointment Apply 1 Application topically 2 (two) times daily. 08/03/24   Cleaster Maxwell A, PA-C  levonorgestrel  (MIRENA ) 20 MCG/DAY IUD 1 each  by Intrauterine route once for 1 dose. 09/08/21 09/08/21  Leonce Garnette BIRCH, MD    Physical Exam Vitals: There were no vitals taken for this visit.  General: NAD HEENT: normocephalic, anicteric Thyroid: no enlargement, no palpable nodules Pulmonary: No increased work of breathing, CTAB Cardiovascular: RRR, distal pulses 2+ Breast: Breast symmetrical, no tenderness, no palpable nodules or masses, no skin or nipple retraction present, no nipple discharge.  No axillary or supraclavicular lymphadenopathy. Abdomen: NABS, soft, non-tender, non-distended.  Umbilicus without lesions.  No hepatomegaly, splenomegaly or masses palpable. No evidence of hernia  Genitourinary:  External:  external female genitalia-atrophic.  Normal urethral meatus, normal Bartholin's and Skene's glands.    Vagina: Normal vaginal mucosa pale in color, no evidence of prolapse.  Some tone   Cervix: Grossly normal in appearance, no bleeding  Uterus: Non-enlarged, mobile, normal contour.  No CMT  Adnexa: ovaries non-enlarged, no adnexal masses  Rectal: deferred  Lymphatic: no evidence of inguinal lymphadenopathy Extremities: no edema, erythema, or tenderness Neurologic: Grossly intact Psychiatric: mood appropriate, affect full  Assessment: 53 y.o. Veronica Stevens routine annual exam  Plan: Problem List Items Addressed This Visit   None   1) Mammogram - recommend yearly screening mammogram.  Mammogram Is up to date  2) STI screening  wasoffered and accepted  3) ASCCP guidelines and rational discussed.  Patient opts for based on results  screening interval  4) Osteoporosis  - per USPTF routine screening DEXA at age 52  Consider  FDA-approved medical therapies in postmenopausal women and men aged 36 years and older, based on the following: a) A hip or vertebral (clinical or morphometric) fracture b) T-score <= -2.5 at the femoral neck or spine after appropriate evaluation to exclude secondary causes C) Low bone mass (T-score between -1.0 and -2.5 at the femoral neck or spine) and a 10-year probability of a hip fracture >= 3% or a 10-year probability of a major osteoporosis-related fracture >= 20% based on the [place] -adapted WHO algorithm   5) Routine healthcare maintenance including cholesterol, diabetes screening discussed Ordered today  6) Colonoscopy done 11/2023 .  Screening recommended starting at age 69 for average risk individuals, age 56 for individuals deemed at increased risk (including African Americans) and recommended to continue until age 74.  For patient age 45-85 individualized approach is recommended.  Gold standard screening is via colonoscopy, Cologuard screening is an acceptable alternative for patient unwilling or unable to undergo colonoscopy.  Colorectal cancer screening for average?risk adults: 2018 guideline update from the American Cancer SocietyCA: A Cancer Journal for Clinicians: Mar 31, 2017   7) Menopause: rec the new menopause Book and Electronic data systems, links placed in AVS. Will start estrogen patch, discussed concern for tobacco use in general increasing her risk of VTE, encouraged to quit. Also discussed weight bearing exercises. Pt has IUD.   RTC in 6 weeks for HRT follow up    Jinnie Cookey, CNM  [facility] 10/16/2024, 8:25 AM                [1] No Known Allergies

## 2024-10-16 ENCOUNTER — Encounter: Payer: Self-pay | Admitting: Licensed Practical Nurse

## 2024-10-16 ENCOUNTER — Ambulatory Visit: Admitting: Licensed Practical Nurse

## 2024-10-16 ENCOUNTER — Other Ambulatory Visit (HOSPITAL_COMMUNITY)
Admission: RE | Admit: 2024-10-16 | Discharge: 2024-10-16 | Disposition: A | Discharge status: Home / Self Care | Source: Ambulatory Visit | Attending: Licensed Practical Nurse | Admitting: Licensed Practical Nurse

## 2024-10-16 VITALS — BP 129/77 | HR 82 | Ht 63.0 in | Wt 173.9 lb

## 2024-10-16 DIAGNOSIS — Z124 Encounter for screening for malignant neoplasm of cervix: Secondary | ICD-10-CM | POA: Insufficient documentation

## 2024-10-16 DIAGNOSIS — F331 Major depressive disorder, recurrent, moderate: Secondary | ICD-10-CM | POA: Diagnosis not present

## 2024-10-16 DIAGNOSIS — Z01419 Encounter for gynecological examination (general) (routine) without abnormal findings: Secondary | ICD-10-CM | POA: Diagnosis not present

## 2024-10-16 DIAGNOSIS — Z113 Encounter for screening for infections with a predominantly sexual mode of transmission: Secondary | ICD-10-CM

## 2024-10-16 DIAGNOSIS — N951 Menopausal and female climacteric states: Secondary | ICD-10-CM

## 2024-10-16 DIAGNOSIS — Z01411 Encounter for gynecological examination (general) (routine) with abnormal findings: Secondary | ICD-10-CM | POA: Diagnosis not present

## 2024-10-16 DIAGNOSIS — Z131 Encounter for screening for diabetes mellitus: Secondary | ICD-10-CM

## 2024-10-16 DIAGNOSIS — Z13220 Encounter for screening for lipoid disorders: Secondary | ICD-10-CM

## 2024-10-16 MED ORDER — ESTRADIOL 0.05 MG/24HR TD PTWK: 0.0500 mg | MEDICATED_PATCH | TRANSDERMAL | 12 refills | Status: AC

## 2024-10-16 NOTE — Patient Instructions (Signed)
 [email or web address]

## 2024-10-17 ENCOUNTER — Ambulatory Visit: Payer: Self-pay | Admitting: Licensed Practical Nurse

## 2024-10-17 LAB — HEMOGLOBIN A1C
Est. average glucose Bld gHb Est-mCnc: 108 mg/dL
Hgb A1c MFr Bld: 5.4 % (ref 4.8–5.6)

## 2024-10-17 LAB — CBC WITH DIFFERENTIAL/PLATELET
Basophils Absolute: 0.1 x10E3/uL (ref 0.0–0.2)
Basos: 1 %
EOS (ABSOLUTE): 0.3 x10E3/uL (ref 0.0–0.4)
Eos: 4 %
Hematocrit: 45 % (ref 34.0–46.6)
Hemoglobin: 15 g/dL (ref 11.1–15.9)
Immature Grans (Abs): 0 x10E3/uL (ref 0.0–0.1)
Immature Granulocytes: 0 %
Lymphocytes Absolute: 1.7 x10E3/uL (ref 0.7–3.1)
Lymphs: 26 %
MCH: 32.4 pg (ref 26.6–33.0)
MCHC: 33.3 g/dL (ref 31.5–35.7)
MCV: 97 fL (ref 79–97)
Monocytes Absolute: 0.4 x10E3/uL (ref 0.1–0.9)
Monocytes: 7 %
Neutrophils Absolute: 4.1 x10E3/uL (ref 1.4–7.0)
Neutrophils: 62 %
Platelets: 371 x10E3/uL (ref 150–450)
RBC: 4.63 x10E6/uL (ref 3.77–5.28)
RDW: 11.9 % (ref 11.7–15.4)
WBC: 6.6 x10E3/uL (ref 3.4–10.8)

## 2024-10-17 LAB — HEP, RPR, HIV PANEL
HIV Screen 4th Generation wRfx: NONREACTIVE
Hepatitis B Surface Ag: NEGATIVE
RPR Ser Ql: NONREACTIVE

## 2024-10-17 LAB — LIPID PANEL
Chol/HDL Ratio: 3.3 ratio (ref 0.0–4.4)
Cholesterol, Total: 221 mg/dL — ABNORMAL HIGH (ref 100–199)
HDL: 68 mg/dL (ref >39)
LDL Chol Calc (NIH): 137 mg/dL — ABNORMAL HIGH (ref 0–99)
Triglycerides: 92 mg/dL (ref 0–149)
VLDL Cholesterol Cal: 16 mg/dL (ref 5–40)

## 2024-10-17 LAB — HEPATITIS C ANTIBODY: Hep C Virus Ab: NONREACTIVE

## 2024-10-20 LAB — CYTOLOGY - PAP
Comment: NEGATIVE
Comment: NEGATIVE
Comment: NEGATIVE
HPV 16: NEGATIVE
HPV 18 / 45: NEGATIVE
High risk HPV: POSITIVE — AB

## 2024-10-23 DIAGNOSIS — F331 Major depressive disorder, recurrent, moderate: Secondary | ICD-10-CM | POA: Diagnosis not present

## 2024-11-27 ENCOUNTER — Ambulatory Visit: Admitting: Licensed Practical Nurse

## 2024-12-18 ENCOUNTER — Encounter: Admitting: Obstetrics & Gynecology
